# Patient Record
Sex: Female | Born: 1985 | Race: White | Hispanic: No | Marital: Married | Smoking: Never smoker
Health system: Southern US, Community
[De-identification: ages and names within clinical notes are randomized; demographics above are authoritative.]

## PROBLEM LIST (undated history)

## (undated) DIAGNOSIS — C50919 Malignant neoplasm of unspecified site of unspecified female breast: Secondary | ICD-10-CM

## (undated) DIAGNOSIS — F410 Panic disorder [episodic paroxysmal anxiety] without agoraphobia: Secondary | ICD-10-CM

## (undated) DIAGNOSIS — F419 Anxiety disorder, unspecified: Secondary | ICD-10-CM

## (undated) HISTORY — DX: Malignant neoplasm of unspecified site of unspecified female breast: C50.919

---

## 2018-06-28 ENCOUNTER — Other Ambulatory Visit: Payer: Self-pay

## 2018-06-28 ENCOUNTER — Emergency Department: Payer: Self-pay

## 2018-06-28 ENCOUNTER — Emergency Department
Admission: EM | Admit: 2018-06-28 | Discharge: 2018-06-28 | Disposition: A | Discharge status: Home / Self Care | Payer: Self-pay | Attending: Emergency Medicine | Admitting: Emergency Medicine

## 2018-06-28 ENCOUNTER — Encounter: Payer: Self-pay | Admitting: Emergency Medicine

## 2018-06-28 DIAGNOSIS — R1031 Right lower quadrant pain: Secondary | ICD-10-CM | POA: Insufficient documentation

## 2018-06-28 LAB — COMPREHENSIVE METABOLIC PANEL
ALT: 17 U/L (ref 0–44)
AST: 14 U/L — ABNORMAL LOW (ref 15–41)
Albumin: 3.9 g/dL (ref 3.5–5.0)
Alkaline Phosphatase: 79 U/L (ref 38–126)
Anion gap: 10 (ref 5–15)
BUN: 15 mg/dL (ref 6–20)
CO2: 23 mmol/L (ref 22–32)
Calcium: 9.1 mg/dL (ref 8.9–10.3)
Chloride: 107 mmol/L (ref 98–111)
Creatinine, Ser: 0.76 mg/dL (ref 0.44–1.00)
GFR calc Af Amer: >60 mL/min (ref >60)
GFR calc non Af Amer: >60 mL/min (ref >60)
Glucose, Bld: 103 mg/dL — ABNORMAL HIGH (ref 70–99)
Potassium: 4 mmol/L (ref 3.5–5.1)
Sodium: 140 mmol/L (ref 135–145)
Total Bilirubin: 0.8 mg/dL (ref 0.3–1.2)
Total Protein: 7.8 g/dL (ref 6.5–8.1)

## 2018-06-28 LAB — POCT PREGNANCY, URINE: Preg Test, Ur: NEGATIVE

## 2018-06-28 LAB — URINALYSIS, COMPLETE (UACMP) WITH MICROSCOPIC
Glucose, UA: NEGATIVE mg/dL
Hgb urine dipstick: NEGATIVE
Ketones, ur: NEGATIVE mg/dL
Nitrite: NEGATIVE
Protein, ur: 30 mg/dL — AB
Specific Gravity, Urine: 1.029 (ref 1.005–1.030)
pH: 5 (ref 5.0–8.0)

## 2018-06-28 LAB — CBC
HCT: 38.7 % (ref 36.0–46.0)
Hemoglobin: 11.8 g/dL — ABNORMAL LOW (ref 12.0–15.0)
MCH: 24.3 pg — ABNORMAL LOW (ref 26.0–34.0)
MCHC: 30.5 g/dL (ref 30.0–36.0)
MCV: 79.6 fL — ABNORMAL LOW (ref 80.0–100.0)
Platelets: 336 10*3/uL (ref 150–400)
RBC: 4.86 MIL/uL (ref 3.87–5.11)
RDW: 13.2 % (ref 11.5–15.5)
WBC: 7 10*3/uL (ref 4.0–10.5)
nRBC: 0 % (ref 0.0–0.2)

## 2018-06-28 LAB — LIPASE, BLOOD: Lipase: 25 U/L (ref 11–51)

## 2018-06-28 MED ORDER — MORPHINE SULFATE (PF) 4 MG/ML IV SOLN
4.0000 mg | Freq: Once | INTRAVENOUS | Status: AC
  Administered 2018-06-28: 4 mg via INTRAVENOUS
  Filled 2018-06-28: qty 1

## 2018-06-28 MED ORDER — IOHEXOL 300 MG/ML  SOLN
125.0000 mL | Freq: Once | INTRAMUSCULAR | Status: AC | PRN
  Administered 2018-06-28: 10:00:00 125 mL via INTRAVENOUS

## 2018-06-28 MED ORDER — IOHEXOL 240 MG/ML SOLN
50.0000 mL | Freq: Once | INTRAMUSCULAR | Status: AC | PRN
  Administered 2018-06-28: 09:00:00 50 mL via ORAL

## 2018-06-28 MED ORDER — ONDANSETRON 4 MG PO TBDP: 4.0000 mg | ORAL_TABLET | Freq: Three times a day (TID) | ORAL | 0 refills | Status: DC | PRN

## 2018-06-28 MED ORDER — SODIUM CHLORIDE 0.9 % IV BOLUS
1000.0000 mL | Freq: Once | INTRAVENOUS | Status: AC
  Administered 2018-06-28: 09:00:00 1000 mL via INTRAVENOUS

## 2018-06-28 MED ORDER — ONDANSETRON HCL 4 MG/2ML IJ SOLN
4.0000 mg | Freq: Once | INTRAMUSCULAR | Status: AC
  Administered 2018-06-28: 09:00:00 4 mg via INTRAVENOUS
  Filled 2018-06-28: qty 2

## 2018-06-28 NOTE — ED Triage Notes (Signed)
Pt presents to ED via POV with c/o RLQ abdominal pain, pt states pain has been intermittent for "years". Pt states sudden reoccurrence this morning. Pt also c/o N/V, states woke up this morning and "vomited blood". Pt A&O x4, obviously uncomfortable upon arrival to ED.

## 2018-06-28 NOTE — ED Notes (Signed)
Pt reports intermittent abdominal pain "for years", reports today pain returned. Pt states "I've had multiple ultrasounds and cat scans for the pain, but they say it might just be a cyst". Pt also reports that her brother and boyfriend both just had their appendix removed.

## 2018-06-28 NOTE — ED Provider Notes (Addendum)
[facility] Emergency Department Provider Note  Time seen: 8:29 AM  I have reviewed the triage vital signs and the nursing notes.   HISTORY  Chief Complaint Abdominal Pain and Hematemesis   HPI Sonya Fisher is a 33 y.o. female with no significant past medical history presents to the emergency department for right lower quadrant abdominal pain.  According to the patient for the past several months she has been experiencing back pain, is currently taking tramadol for her back pain.  States this morning she was nauseated and had several episodes of vomiting, the last episode of vomiting had some blood streaking in it.  Patient states she then developed moderate to severe right lower quadrant abdominal pain so she came to the emergency department for evaluation.  Patient continues to state severe sharp right lower quadrant abdominal pain.  Denies any vaginal bleeding, does state mild dysuria this morning.  Denies any hematuria.  Patient states she has been experiencing similar right lower quadrant abdominal pain intermittently for years but is never found a cause for the pain per patient.  Denies any fever cough or shortness of breath.   History reviewed. No pertinent past medical history.  There are no active problems to display for this patient.   History reviewed. No pertinent surgical history.  Prior to Admission medications   Not on File    Allergies  Allergen Reactions  . Amoxicillin   . Augmentin [Amoxicillin-Pot Clavulanate]   . Keflex [Cephalexin]   . Penicillins   . Sulfa Antibiotics     History reviewed. No pertinent family history.  Social History Social History   Tobacco Use  . Smoking status: Never Smoker  . Smokeless tobacco: Never Used  Substance Use Topics  . Alcohol use: Yes    Comment: Occ  . Drug use: Never    Review of Systems Constitutional: Negative for fever. Cardiovascular: Negative for chest pain. Respiratory: Negative  for shortness of breath. Gastrointestinal: Positive for right lower quadrant abdominal pain, nausea vomiting.  Negative for diarrhea. Genitourinary: Mild dysuria.  Negative for hematuria. Musculoskeletal: Negative for musculoskeletal complaints Skin: Negative for skin complaints  Neurological: Negative for headache All other ROS negative  ____________________________________________   PHYSICAL EXAM:  VITAL SIGNS: ED Triage Vitals  Enc Vitals Group     BP 06/28/18 0818 (!) 92/59     Pulse Rate 06/28/18 0818 88     Resp 06/28/18 0818 19     Temp 06/28/18 0818 98.4 F (36.9 C)     Temp Source 06/28/18 0818 Oral     SpO2 06/28/18 0818 96 %     Weight 06/28/18 0818 (!) 332 lb (150.6 kg)     Height 06/28/18 0818 5\' 9"  (1.753 m)     Head Circumference --      Peak Flow --      Pain Score 06/28/18 0814 9     Pain Loc --      Pain Edu? --      Excl. in [place]? --    Constitutional: Alert and oriented.  Mild distress holding her right lower quadrant. Eyes: Normal exam ENT      Head: Normocephalic and atraumatic.      Mouth/Throat: Mucous membranes are moist. Cardiovascular: Normal rate, regular rhythm. Respiratory: Normal respiratory effort without tachypnea nor retractions. Breath sounds are clear Gastrointestinal: Soft, moderate right lower quadrant tenderness to palpation.  No rebound guarding or distention. Musculoskeletal: Nontender with normal range of motion in all extremities.  Neurologic:  Normal speech and language. No gross focal neurologic deficits  Skin:  Skin is warm, dry and intact.  Psychiatric: Mood and affect are normal.     RADIOLOGY  IMPRESSION:  1. No demonstrable bowel obstruction. No abscess in the abdomen or  pelvis. No periappendiceal region inflammatory change.   2. Subcentimeter right sided lymph nodes. In the appropriate  clinical setting, this finding could be indicative of a degree of  mesenteric adenitis. There is no adenopathy by size criteria  in the  abdomen or pelvis.   3. 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 It may be prudent to  consider follow-up CT of the adrenals in 1 year to confirm  stability.   4. No evident renal or ureteral calculus. No hydronephrosis. Urinary  bladder wall thickness is within normal limits.   ____________________________________________   INITIAL IMPRESSION / ASSESSMENT AND PLAN / ED COURSE  Pertinent labs & imaging results that were available during my care of the patient were reviewed by me and considered in my medical decision making (see chart for details).   Patient presents to the emergency department for right lower quadrant abdominal pain.  Patient has moderate tenderness to palpation of the right lower quadrant.  Differential at this time would include appendicitis, ureterolithiasis, pyelonephritis or urinary tract infection, ovarian cyst, hemorrhagic cyst, chronic pain.  We will check labs, dose pain and nausea medication.  Given the reported severity of the pain we will proceed with CT imaging to rule out appendicitis or other intra-abdominal pathology.  Patient agreeable to plan of care.  Patient CT is largely negative for acute abnormality besides a possibility of mesenteric adenitis.  We will discharge the patient home with nausea medication, patient has Ultram she is currently taking at home.  I discussed CT findings including 1 cm left adrenal gland cyst and the need for repeat imaging in 1 year through her PCP.  Patient understands and is agreeable.  [name] was evaluated in Emergency Department on 06/28/2018 for the symptoms described in the history of present illness. She was evaluated in the context of the global COVID-19 pandemic, which necessitated consideration that the patient might be at risk for infection with the SARS-CoV-2 virus that causes COVID-19. Institutional protocols and algorithms that pertain to the evaluation of patients at risk for COVID-19 are in a state  of rapid change based on information released by regulatory bodies including the CDC and federal and state organizations. These policies and algorithms were followed during the patient's care in the ED.  ____________________________________________   FINAL CLINICAL IMPRESSION(S) / ED DIAGNOSES  Right lower quadrant abdominal pain Nausea vomiting   Harvest Dark, MD 06/28/18 1005    Harvest Dark, MD 06/28/18 1009

## 2018-07-02 ENCOUNTER — Encounter: Payer: Self-pay | Admitting: Emergency Medicine

## 2018-07-02 ENCOUNTER — Emergency Department: Payer: Self-pay

## 2018-07-02 ENCOUNTER — Emergency Department
Admission: EM | Admit: 2018-07-02 | Discharge: 2018-07-02 | Disposition: A | Discharge status: Home / Self Care | Payer: Self-pay | Attending: Emergency Medicine | Admitting: Emergency Medicine

## 2018-07-02 ENCOUNTER — Other Ambulatory Visit: Payer: Self-pay

## 2018-07-02 DIAGNOSIS — R079 Chest pain, unspecified: Secondary | ICD-10-CM | POA: Insufficient documentation

## 2018-07-02 DIAGNOSIS — R059 Cough, unspecified: Secondary | ICD-10-CM

## 2018-07-02 DIAGNOSIS — R05 Cough: Secondary | ICD-10-CM | POA: Insufficient documentation

## 2018-07-02 HISTORY — DX: Panic disorder (episodic paroxysmal anxiety): F41.0

## 2018-07-02 HISTORY — DX: Anxiety disorder, unspecified: F41.9

## 2018-07-02 LAB — BASIC METABOLIC PANEL
Anion gap: 8 (ref 5–15)
BUN: 15 mg/dL (ref 6–20)
CO2: 24 mmol/L (ref 22–32)
Calcium: 8.9 mg/dL (ref 8.9–10.3)
Chloride: 108 mmol/L (ref 98–111)
Creatinine, Ser: 0.83 mg/dL (ref 0.44–1.00)
GFR calc Af Amer: >60 mL/min (ref >60)
GFR calc non Af Amer: >60 mL/min (ref >60)
Glucose, Bld: 122 mg/dL — ABNORMAL HIGH (ref 70–99)
Potassium: 3.8 mmol/L (ref 3.5–5.1)
Sodium: 140 mmol/L (ref 135–145)

## 2018-07-02 LAB — CBC
HCT: 38.3 % (ref 36.0–46.0)
Hemoglobin: 11.6 g/dL — ABNORMAL LOW (ref 12.0–15.0)
MCH: 24.6 pg — ABNORMAL LOW (ref 26.0–34.0)
MCHC: 30.3 g/dL (ref 30.0–36.0)
MCV: 81.3 fL (ref 80.0–100.0)
Platelets: 330 10*3/uL (ref 150–400)
RBC: 4.71 MIL/uL (ref 3.87–5.11)
RDW: 13.4 % (ref 11.5–15.5)
WBC: 8.1 10*3/uL (ref 4.0–10.5)
nRBC: 0 % (ref 0.0–0.2)

## 2018-07-02 LAB — TROPONIN I (HIGH SENSITIVITY): Troponin I (High Sensitivity): <2 ng/L (ref <18)

## 2018-07-02 MED ORDER — ALBUTEROL SULFATE HFA 108 (90 BASE) MCG/ACT IN AERS
2.0000 | INHALATION_SPRAY | RESPIRATORY_TRACT | Status: DC | PRN
  Administered 2018-07-02: 10:00:00 2 via RESPIRATORY_TRACT
  Filled 2018-07-02: qty 6.7

## 2018-07-02 NOTE — ED Notes (Signed)
Patient states that her pain has increased. Vital signs remain stable. Patient given update on wait time.

## 2018-07-02 NOTE — Discharge Instructions (Addendum)
Please seek medical attention for any high fevers, chest pain, shortness of breath, change in behavior, persistent vomiting, bloody stool or any other new or concerning symptoms.  

## 2018-07-02 NOTE — ED Triage Notes (Signed)
Patient with complaint of central chest pain, shortness of breath and dizziness that started about 20:00 last night. Patient states that she also has a rash to her lower abdomen that started yesterday afternoon.

## 2018-07-02 NOTE — ED Provider Notes (Signed)
[facility] Emergency Department Provider Note  ____________________________________________   I have reviewed the triage vital signs and the nursing notes.   HISTORY  Chief Complaint Chest Pain   History limited by: Not Limited   HPI Sonya Fisher is a 33 y.o. female who presents to the emergency department today because of concern for chest pain that started roughly 12 hours ago. The patient describes it as a tight feeling across her chest, with the worst tightness in the center of her chest. She says she has had some cough with this before. Denies similar symptoms in the past. She denies any unusual activity. Denies any fevers.    Records reviewed. Per medical record review patient has a history of anxiety. Visit to OSH ED last year for chest pain.   Past Medical History:  Diagnosis Date  . Anxiety   . Panic attacks     There are no active problems to display for this patient.   History reviewed. No pertinent surgical history.  Prior to Admission medications   Medication Sig Start Date End Date Taking? Authorizing Provider  ondansetron (ZOFRAN ODT) 4 MG disintegrating tablet Take 1 tablet (4 mg total) by mouth every 8 (eight) hours as needed for nausea or vomiting. 06/28/18   Harvest Dark, MD    Allergies Amoxicillin, Augmentin [amoxicillin-pot clavulanate], Keflex [cephalexin], Penicillins, and Sulfa antibiotics  No family history on file.  Social History Social History   Tobacco Use  . Smoking status: Never Smoker  . Smokeless tobacco: Never Used  Substance Use Topics  . Alcohol use: Not Currently  . Drug use: Never    Review of Systems Constitutional: No fever/chills Eyes: No visual changes. ENT: No sore throat. Cardiovascular: Positive for chest pain.  Respiratory: Positive for shortness of breath and cough. Gastrointestinal: No abdominal pain.  No nausea, no vomiting.  No diarrhea.   Genitourinary: Negative for  dysuria. Musculoskeletal: Negative for back pain. Skin: Positive for hives. Neurological: Negative for headaches, focal weakness or numbness.  ____________________________________________   PHYSICAL EXAM:  VITAL SIGNS: ED Triage Vitals  Enc Vitals Group     BP 07/02/18 0321 121/71     Pulse Rate 07/02/18 0320 68     Resp 07/02/18 0320 18     Temp 07/02/18 0320 98.7 F (37.1 C)     Temp Source 07/02/18 0320 Oral     SpO2 07/02/18 0320 97 %     Weight 07/02/18 0320 (!) 330 lb 11 oz (150 kg)     Height 07/02/18 0320 5\' 9"  (1.753 m)     Head Circumference --      Peak Flow --      Pain Score 07/02/18 0320 8   Constitutional: Alert and oriented.  Eyes: Conjunctivae are normal.  ENT      Head: Normocephalic and atraumatic.      Nose: No congestion/rhinnorhea.      Mouth/Throat: Mucous membranes are moist.      Neck: No stridor. Hematological/Lymphatic/Immunilogical: No cervical lymphadenopathy. Cardiovascular: Normal rate, regular rhythm.  No murmurs, rubs, or gallops.  Respiratory: Normal respiratory effort without tachypnea nor retractions. Breath sounds are clear and equal bilaterally. No wheezes/rales/rhonchi. Gastrointestinal: Soft and non tender. No rebound. No guarding.  Genitourinary: Deferred Musculoskeletal: Normal range of motion in all extremities. No lower extremity edema. Neurologic:  Normal speech and language. No gross focal neurologic deficits are appreciated.  Skin:  Skin is warm, dry and intact. No rash noted. Psychiatric: Mood and affect are  normal. Speech and behavior are normal. Patient exhibits appropriate insight and judgment.  ____________________________________________    LABS (pertinent positives/negatives)  Trop high sensitivity <2 CBC wbc 8.1, hgb 11.6, plt 330 BMP wnl except glu 122  ____________________________________________   EKG  I, Nance Pear, attending physician, personally viewed and interpreted this EKG  EKG Time:  0328 Rate: 66 Rhythm: normal sinus rhythm Axis: normal Intervals: qtc 440 QRS: narrow ST changes: no st elevation Impression: normal ekg  ____________________________________________    RADIOLOGY  CXR No acute abnormality  ____________________________________________   PROCEDURES  Procedures  ____________________________________________   INITIAL IMPRESSION / ASSESSMENT AND PLAN / ED COURSE  Pertinent labs & imaging results that were available during my care of the patient were reviewed by me and considered in my medical decision making (see chart for details).   Patient presented to the emergency department today because of concern for chest tightness, cough and some shortness of breath. CXR without pna or ptx. Troponin negative. EKG without concerning arrhythmia. She did feel some improvement after albuterol inhaler. Will plan on discharging with PCP follow up. Discussed findings and plan with patient.  ____________________________________________   FINAL CLINICAL IMPRESSION(S) / ED DIAGNOSES  Final diagnoses:  Nonspecific chest pain  Cough     Note: This dictation was prepared with Dragon dictation. Any transcriptional errors that result from this process are unintentional     Nance Pear, MD 07/02/18 [phone]

## 2018-07-13 ENCOUNTER — Emergency Department
Admission: EM | Admit: 2018-07-13 | Discharge: 2018-07-13 | Disposition: A | Discharge status: Home / Self Care | Payer: Self-pay | Attending: Emergency Medicine | Admitting: Emergency Medicine

## 2018-07-13 ENCOUNTER — Emergency Department: Payer: Self-pay

## 2018-07-13 DIAGNOSIS — R1031 Right lower quadrant pain: Secondary | ICD-10-CM | POA: Insufficient documentation

## 2018-07-13 LAB — COMPREHENSIVE METABOLIC PANEL
ALT: 16 U/L (ref 0–44)
AST: 16 U/L (ref 15–41)
Albumin: 4 g/dL (ref 3.5–5.0)
Alkaline Phosphatase: 74 U/L (ref 38–126)
Anion gap: 7 (ref 5–15)
BUN: 13 mg/dL (ref 6–20)
CO2: 26 mmol/L (ref 22–32)
Calcium: 9.2 mg/dL (ref 8.9–10.3)
Chloride: 106 mmol/L (ref 98–111)
Creatinine, Ser: 0.79 mg/dL (ref 0.44–1.00)
GFR calc Af Amer: >60 mL/min (ref >60)
GFR calc non Af Amer: >60 mL/min (ref >60)
Glucose, Bld: 106 mg/dL — ABNORMAL HIGH (ref 70–99)
Potassium: 4.1 mmol/L (ref 3.5–5.1)
Sodium: 139 mmol/L (ref 135–145)
Total Bilirubin: 0.3 mg/dL (ref 0.3–1.2)
Total Protein: 7.3 g/dL (ref 6.5–8.1)

## 2018-07-13 LAB — URINALYSIS, COMPLETE (UACMP) WITH MICROSCOPIC
Bacteria, UA: NONE SEEN
Bilirubin Urine: NEGATIVE
Glucose, UA: NEGATIVE mg/dL
Hgb urine dipstick: NEGATIVE
Ketones, ur: NEGATIVE mg/dL
Nitrite: NEGATIVE
Protein, ur: NEGATIVE mg/dL
Specific Gravity, Urine: 1.03 (ref 1.005–1.030)
pH: 5 (ref 5.0–8.0)

## 2018-07-13 LAB — CBC
HCT: 36.3 % (ref 36.0–46.0)
Hemoglobin: 11 g/dL — ABNORMAL LOW (ref 12.0–15.0)
MCH: 24.2 pg — ABNORMAL LOW (ref 26.0–34.0)
MCHC: 30.3 g/dL (ref 30.0–36.0)
MCV: 79.8 fL — ABNORMAL LOW (ref 80.0–100.0)
Platelets: 380 10*3/uL (ref 150–400)
RBC: 4.55 MIL/uL (ref 3.87–5.11)
RDW: 13.5 % (ref 11.5–15.5)
WBC: 8.2 10*3/uL (ref 4.0–10.5)
nRBC: 0 % (ref 0.0–0.2)

## 2018-07-13 LAB — POCT PREGNANCY, URINE: Preg Test, Ur: NEGATIVE

## 2018-07-13 LAB — LIPASE, BLOOD: Lipase: 31 U/L (ref 11–51)

## 2018-07-13 MED ORDER — ONDANSETRON HCL 4 MG/2ML IJ SOLN
INTRAMUSCULAR | Status: AC
  Administered 2018-07-13: 4 mg
  Filled 2018-07-13: qty 2

## 2018-07-13 MED ORDER — DICYCLOMINE HCL 20 MG PO TABS: 20.0000 mg | ORAL_TABLET | Freq: Three times a day (TID) | ORAL | 0 refills | Status: DC | PRN

## 2018-07-13 MED ORDER — MORPHINE SULFATE (PF) 2 MG/ML IV SOLN
INTRAVENOUS | Status: AC
  Administered 2018-07-13: 2 mg via INTRAVENOUS
  Filled 2018-07-13: qty 1

## 2018-07-13 MED ORDER — IOPAMIDOL (ISOVUE-370) INJECTION 76%
100.0000 mL | Freq: Once | INTRAVENOUS | Status: AC | PRN
  Administered 2018-07-13: 100 mL via INTRAVENOUS

## 2018-07-13 NOTE — ED Provider Notes (Signed)
[facility] Emergency Department Provider Note   First MD Initiated Contact with Patient 07/13/18 0222     (approximate)  I have reviewed the triage vital signs and the nursing notes.   HISTORY  Chief Complaint Abdominal Pain   HPI Sonya Fisher is a 33 y.o. female with below list of previous medical conditions presents to the emergency department secondary to right lower quadrant abdominal pain which patient states is currently 10 out of 10.  Patient denies any nausea vomiting diarrhea constipation.  Patient denies any fever.  Patient states that she has had similar episodes of pain in the same area over the past 3 years however no clear etiology identified.  Patient states that she has never seen gastroenterology in regards to the pain.        Past Medical History:  Diagnosis Date   Anxiety    Panic attacks     There are no active problems to display for this patient.   History reviewed. No pertinent surgical history.  Prior to Admission medications   Medication Sig Start Date End Date Taking? Authorizing Provider  ondansetron (ZOFRAN ODT) 4 MG disintegrating tablet Take 1 tablet (4 mg total) by mouth every 8 (eight) hours as needed for nausea or vomiting. 06/28/18   Harvest Dark, MD    Allergies Amoxicillin, Augmentin [amoxicillin-pot clavulanate], Keflex [cephalexin], Penicillins, Sulfa antibiotics, and Transact [sulfur]  No family history on file.  Social History Social History   Tobacco Use   Smoking status: Never Smoker   Smokeless tobacco: Never Used  Substance Use Topics   Alcohol use: Not Currently   Drug use: Never    Review of Systems Constitutional: No fever/chills Eyes: No visual changes. ENT: No sore throat. Cardiovascular: Denies chest pain. Respiratory: Denies shortness of breath. Gastrointestinal: Positive for abdominal pain.  No nausea, no vomiting.  No diarrhea.  No constipation. Genitourinary: Negative  for dysuria. Musculoskeletal: Negative for neck pain.  Negative for back pain. Integumentary: Negative for rash. Neurological: Negative for headaches, focal weakness or numbness.   ____________________________________________   PHYSICAL EXAM:  VITAL SIGNS: ED Triage Vitals  Enc Vitals Group     BP 07/13/18 0043 (!) 141/59     Pulse Rate 07/13/18 0043 82     Resp 07/13/18 0043 18     Temp 07/13/18 0043 98 F (36.7 C)     Temp Source 07/13/18 0043 Oral     SpO2 07/13/18 0043 99 %     Weight 07/13/18 0044 (!) 150.6 kg (332 lb)     Height 07/13/18 0044 1.753 m (5\' 9" )     Head Circumference --      Peak Flow --      Pain Score 07/13/18 0235 10     Pain Loc --      Pain Edu? --      Excl. in [place]? --     Constitutional: Alert and oriented. Well appearing and in no acute distress. Eyes: Conjunctivae are normal.  Mouth/Throat: Mucous membranes are moist.  Oropharynx non-erythematous. Neck: No stridor.  Cardiovascular: Normal rate, regular rhythm. Good peripheral circulation. Grossly normal heart sounds. Respiratory: Normal respiratory effort.  No retractions. No audible wheezing. Gastrointestinal: Right lower quadrant tenderness to palpation.  No distention.  Musculoskeletal: No lower extremity tenderness nor edema. No gross deformities of extremities. Neurologic:  Normal speech and language. No gross focal neurologic deficits are appreciated.  Skin:  Skin is warm, dry and intact. No rash noted. Psychiatric: Mood  and affect are normal. Speech and behavior are normal.  ____________________________________________   LABS (all labs ordered are listed, but only abnormal results are displayed)  Labs Reviewed  COMPREHENSIVE METABOLIC PANEL - Abnormal; Notable for the following components:      Result Value   Glucose, Bld 106 (*)    All other components within normal limits  CBC - Abnormal; Notable for the following components:   Hemoglobin 11.0 (*)    MCV 79.8 (*)    MCH 24.2  (*)    All other components within normal limits  URINALYSIS, COMPLETE (UACMP) WITH MICROSCOPIC - Abnormal; Notable for the following components:   Color, Urine YELLOW (*)    APPearance CLOUDY (*)    Leukocytes,Ua TRACE (*)    All other components within normal limits  LIPASE, BLOOD  POC URINE PREG, ED  POCT PREGNANCY, URINE   __________  RADIOLOGY I, Carrizo Hill N Macrae Wiegman, personally viewed and evaluated these images (plain radiographs) as part of my medical decision making, as well as reviewing the written report by the radiologist.  ED MD interpretation: No acute abnormality or explanation for the patient's abdominal pain noted on CT abdomen pelvis per radiologist.  Official radiology report(s): Ct Abdomen Pelvis W Contrast  Result Date: 07/13/2018 CLINICAL DATA:  Right lower quadrant abdominal pain. EXAM: CT ABDOMEN AND PELVIS WITH CONTRAST TECHNIQUE: Multidetector CT imaging of the abdomen and pelvis was performed using the standard protocol following bolus administration of intravenous contrast. CONTRAST:  128mL ISOVUE-370 IOPAMIDOL (ISOVUE-370) INJECTION 76% COMPARISON:  CT 2 weeks ago, 06/28/2018 FINDINGS: Lower chest: Minor hypoventilatory change. No consolidation or pleural fluid. Hepatobiliary: No focal liver abnormality is seen. No gallstones, gallbladder wall thickening, or biliary dilatation. Pancreas: No ductal dilatation or inflammation. Spleen: Normal in size without focal abnormality. Adrenals/Urinary Tract: 1 cm left adrenal nodule, unchanged from recent exam. Normal right adrenal gland. No hydronephrosis or perinephric edema. Homogeneous renal enhancement. Urinary bladder is minimally distended without wall thickening. Stomach/Bowel: Lack of enteric contrast limits detailed bowel assessment. Stomach physiologically distended. No bowel wall thickening, inflammatory change, or obstruction. Normal appendix, image 49 series 5. Small to moderate colonic stool burden without colonic  wall thickening or inflammation. Vascular/Lymphatic: Normal caliber abdominal aorta. Multiple small retroperitoneal and central mesenteric nodes, not enlarged by size criteria. Portal vein is patent. Reproductive: Uterus and bilateral adnexa are unremarkable. Other: No free air, free fluid, or intra-abdominal fluid collection. Musculoskeletal: There are no acute or suspicious osseous abnormalities. IMPRESSION: 1. No acute abnormality or explanation for abdominal pain. Normal appendix. 2. Stable 1 cm left adrenal nodule, likely a benign adenoma. Electronically Signed   By: Keith Rake M.D.   On: 07/13/2018 03:39      Procedures   ____________________________________________   INITIAL IMPRESSION / MDM / [place] / ED COURSE  As part of my medical decision making, I reviewed the following data within the electronic MEDICAL RECORD NUMBER  33 year old female presented with above-stated history and physical exam secondary to right lower quadrant abdominal pain.  Considered possibly of appendicitis ovarian cysts ureterolithiasis colitis and inflammatory bowel disease given chronicity of the patient's symptoms CT scan revealed no acute abnormality or explanation for the patient's pain.  Patient will be referred to gastroenterology Dr. Allen Norris for further outpatient evaluation.  ____________________________________________  FINAL CLINICAL IMPRESSION(S) / ED DIAGNOSES  Final diagnoses:  Right lower quadrant abdominal pain     MEDICATIONS GIVEN DURING THIS VISIT:  Medications  ondansetron (ZOFRAN) 4 MG/2ML injection (4 mg  Given 07/13/18 0230)  morphine 2 MG/ML injection (2 mg Intravenous Given 07/13/18 0230)  iopamidol (ISOVUE-370) 76 % injection 100 mL (100 mLs Intravenous Contrast Given 07/13/18 0310)     ED Discharge Orders    None      *Please note:  Sonya Fisher was evaluated in Emergency Department on 07/13/2018 for the symptoms described in the history of present illness. She  was evaluated in the context of the global COVID-19 pandemic, which necessitated consideration that the patient might be at risk for infection with the SARS-CoV-2 virus that causes COVID-19. Institutional protocols and algorithms that pertain to the evaluation of patients at risk for COVID-19 are in a state of rapid change based on information released by regulatory bodies including the CDC and federal and state organizations. These policies and algorithms were followed during the patient's care in the ED.  Some ED evaluations and interventions may be delayed as a result of limited staffing during the pandemic.*  Note:  This document was prepared using Dragon voice recognition software and may include unintentional dictation errors.   Gregor Hams, MD 07/13/18 [phone]

## 2018-07-13 NOTE — ED Triage Notes (Signed)
Patient c/o lower right quadrant abdominal pain and diarrhea. Patient denies urinary changes.

## 2018-08-14 ENCOUNTER — Other Ambulatory Visit: Payer: Self-pay

## 2018-08-15 ENCOUNTER — Ambulatory Visit (INDEPENDENT_AMBULATORY_CARE_PROVIDER_SITE_OTHER): Payer: Self-pay | Admitting: Gastroenterology

## 2018-08-15 ENCOUNTER — Other Ambulatory Visit: Payer: Self-pay

## 2018-08-15 ENCOUNTER — Encounter: Payer: Self-pay | Admitting: Gastroenterology

## 2018-08-15 VITALS — BP 122/73 | HR 77 | Temp 98.8°F | Ht 69.0 in | Wt 339.6 lb

## 2018-08-15 DIAGNOSIS — K582 Mixed irritable bowel syndrome: Secondary | ICD-10-CM

## 2018-08-15 MED ORDER — DICYCLOMINE HCL 20 MG PO TABS: 20.0000 mg | ORAL_TABLET | Freq: Three times a day (TID) | ORAL | 3 refills | Status: DC

## 2018-08-15 NOTE — Progress Notes (Signed)
Gastroenterology Consultation  Referring Provider:     Dr. Archie Balboa Primary Care Physician:  Patient, No Pcp Per Primary Gastroenterologist:  Dr. Allen Norris     Reason for Consultation:     Abdominal pain with alternating diarrhea constipation        HPI:   Sonya Fisher is a 33 y.o. y/o female referred for consultation & management of abdominal pain with alternating diarrhea and constipation by Dr. Patient, No Pcp Per.  This patient comes in today after being in the ER multiple times for abdominal pain.  The patient has had imaging of her abdomen without any cause for her GI symptoms.  The patient states that she usually has diarrhea in the morning and then again in the evening with anywhere from 1-2 episodes of diarrhea a day.  She states that this then will alternate with constipation which she states is occurring right now.  The patient states that she was told in the ER that she may have Crohn's disease despite not having any bloody stools unexplained weight loss or extraintestinal manifestations of Crohn's disease.  She also denies any family history of inflammatory bowel disease.  The patient is obese and states that she has recently gained weight and not lost weight.  The diarrhea when it occurs does not wake her up from a sound sleep.  She also reports that she feels better after she moves her bowels.  Past Medical History:  Diagnosis Date  . Anxiety   . Panic attacks     History reviewed. No pertinent surgical history.  Prior to Admission medications   Medication Sig Start Date End Date Taking? Authorizing Provider  dicyclomine (BENTYL) 20 MG tablet Take 1 tablet (20 mg total) by mouth 3 (three) times daily as needed for spasms. 07/13/18 07/13/19 Yes Gregor Hams, MD  escitalopram (LEXAPRO) 20 MG tablet Take 20 mg by mouth daily. 07/10/18  Yes [provider]  naproxen (NAPROSYN) 500 MG tablet Take by mouth.   Yes [provider]  omeprazole (PRILOSEC) 20 MG capsule  Take 20 mg by mouth daily. 06/01/18  Yes [provider]  ondansetron (ZOFRAN ODT) 4 MG disintegrating tablet Take 1 tablet (4 mg total) by mouth every 8 (eight) hours as needed for nausea or vomiting. 06/28/18  Yes Harvest Dark, MD  tiZANidine (ZANAFLEX) 4 MG tablet TAKE 1 TABLET BY MOUTH THREE TIMES DAILY AS NEEDED FOR MUSCLE SPASM UP TO FOR 30 DOSES 06/20/18  Yes [provider]  traMADol (ULTRAM) 50 MG tablet TAKE 1 TABLET BY MOUTH EVERY 8 HOURS AS NEEDED FOR UP TO 5 DAYS 06/20/18  Yes [provider]    History reviewed. No pertinent family history.   Social History   Tobacco Use  . Smoking status: Never Smoker  . Smokeless tobacco: Never Used  Substance Use Topics  . Alcohol use: Not Currently  . Drug use: Never    Allergies as of 08/15/2018 - Review Complete 08/15/2018  Allergen Reaction Noted  . Amoxicillin  06/28/2018  . Augmentin [amoxicillin-pot clavulanate]  06/28/2018  . Keflex [cephalexin]  06/28/2018  . Penicillins  06/28/2018  . Sulfa antibiotics  06/28/2018  . Transact [sulfur]  07/13/2018    Review of Systems:    All systems reviewed and negative except where noted in HPI.   Physical Exam:  BP 122/73   Pulse 77   Temp 98.8 F (37.1 C) (Oral)   Ht 5\' 9"  (1.753 m)   Wt (!) 339 lb  9.6 oz (154 kg)   BMI 50.15 kg/m  No LMP recorded. General:   Alert,  Well-developed, well-nourished, pleasant and cooperative in NAD Head:  Normocephalic and atraumatic. Eyes:  Sclera clear, no icterus.   Conjunctiva pink. Ears:  Normal auditory acuity. Nose:  No deformity, discharge, or lesions. Mouth:  No deformity or lesions,oropharynx pink & moist. Neck:  Supple; no masses or thyromegaly. Lungs:  Respirations even and unlabored.  Clear throughout to auscultation.   No wheezes, crackles, or rhonchi. No acute distress. Heart:  Regular rate and rhythm; no murmurs, clicks, rubs, or gallops. Abdomen:  Normal bowel sounds.  No bruits.  Soft,  non-tender and non-distended without masses, hepatosplenomegaly or hernias noted.  No guarding or rebound tenderness.  Negative Carnett sign.   Rectal:  Deferred.  Msk:  Symmetrical without gross deformities.  Good, equal movement & strength bilaterally. Pulses:  Normal pulses noted. Extremities:  No clubbing or edema.  No cyanosis. Neurologic:  Alert and oriented x3;  grossly normal neurologically. Skin:  Intact without significant lesions or rashes.  No jaundice. Lymph Nodes:  No significant cervical adenopathy. Psych:  Alert and cooperative. Normal mood and affect.  Imaging Studies: No results found.  Assessment and Plan:   Sonya Fisher is a 33 y.o. y/o female who comes in today with alternating diarrhea constipation with abdominal bloating and abdominal pain.  The patient does not have any worry symptoms such as rectal bleeding, family history of colon cancer or colon polyps or unexplained weight loss.  Her symptoms are most consistent with irritable bowel syndrome and she has done better with the short course of dicyclomine she was given at the ER.  The patient was told to increase fiber in her diet and has been given samples of Metamucil.  The patient has been told that Citrucel may be less gas-forming and she may do better with that.  She has been told that if her symptoms do not improve then she can contact our office for further recommendations.  The patient has been explained the plan and agrees with it.  Lucilla Lame, MD. Marval Regal    Note: This dictation was prepared with Dragon dictation along with smaller phrase technology. Any transcriptional errors that result from this process are unintentional.

## 2018-08-23 ENCOUNTER — Ambulatory Visit: Payer: Self-pay | Admitting: Gastroenterology

## 2018-12-01 ENCOUNTER — Other Ambulatory Visit: Payer: Self-pay

## 2018-12-01 ENCOUNTER — Encounter: Payer: Self-pay | Admitting: Emergency Medicine

## 2018-12-01 DIAGNOSIS — N751 Abscess of Bartholin's gland: Secondary | ICD-10-CM | POA: Insufficient documentation

## 2018-12-01 DIAGNOSIS — Z79899 Other long term (current) drug therapy: Secondary | ICD-10-CM | POA: Insufficient documentation

## 2018-12-01 NOTE — ED Triage Notes (Signed)
Pt arrived via POV with reports of vaginal pain due to abscess on vagina.  Pt states it has been there for several days, states its worse with walking.   Pt states she has noticed some small red spots on pad, but otherwise denies any discharge.  Pt states she has hx of the same kind of abscess in the past.

## 2018-12-01 NOTE — ED Notes (Signed)
FIRST NURSE NOTE:  Pt c/o boil to vaginal area.

## 2018-12-02 ENCOUNTER — Emergency Department
Admission: EM | Admit: 2018-12-02 | Discharge: 2018-12-02 | Disposition: A | Discharge status: Home / Self Care | Payer: Self-pay | Attending: Emergency Medicine | Admitting: Emergency Medicine

## 2018-12-02 DIAGNOSIS — N751 Abscess of Bartholin's gland: Secondary | ICD-10-CM

## 2018-12-02 MED ORDER — CLINDAMYCIN HCL 150 MG PO CAPS
300.0000 mg | ORAL_CAPSULE | Freq: Once | ORAL | Status: AC
  Administered 2018-12-02: 300 mg via ORAL
  Filled 2018-12-02: qty 2

## 2018-12-02 MED ORDER — CLINDAMYCIN HCL 300 MG PO CAPS: 300.0000 mg | ORAL_CAPSULE | Freq: Three times a day (TID) | ORAL | 0 refills | Status: AC

## 2018-12-02 MED ORDER — FLUCONAZOLE 50 MG PO TABS
150.0000 mg | ORAL_TABLET | Freq: Once | ORAL | Status: AC
  Administered 2018-12-02: 150 mg via ORAL
  Filled 2018-12-02: qty 1

## 2018-12-02 MED ORDER — OXYCODONE-ACETAMINOPHEN 5-325 MG PO TABS: 1.0000 | ORAL_TABLET | ORAL | 0 refills | Status: AC | PRN

## 2018-12-02 MED ORDER — OXYCODONE-ACETAMINOPHEN 5-325 MG PO TABS
1.0000 | ORAL_TABLET | Freq: Once | ORAL | Status: AC
  Administered 2018-12-02: 02:00:00 1 via ORAL
  Filled 2018-12-02: qty 1

## 2018-12-02 NOTE — ED Provider Notes (Signed)
[facility] Emergency Department Provider Note ______   First MD Initiated Contact with Patient 12/02/18 0131     (approximate)  I have reviewed the triage vital signs and the nursing notes.   HISTORY  Chief Complaint Vaginal Pain (Abscess on vagina per pt)   HPI Sonya Fisher is a 33 y.o. female with below list of previous medical conditions including previous Bartholin abscess presents to the emergency department secondary to 2-day history of left side vaginal discomfort with concern for possible recurrence of abscess patient says she has had at least 3 or 4 in the past "couple years".  Patient denies any fever.  Patient also states that she believes she may have a yeast infection for which she has been taking Azo and cranberry juice        Past Medical History:  Diagnosis Date  . Anxiety   . Panic attacks     There are no active problems to display for this patient.   History reviewed. No pertinent surgical history.  Prior to Admission medications   Medication Sig Start Date End Date Taking? Authorizing Provider  dicyclomine (BENTYL) 20 MG tablet Take 1 tablet (20 mg total) by mouth 3 (three) times daily before meals. 08/15/18   Lucilla Lame, MD  escitalopram (LEXAPRO) 20 MG tablet Take 20 mg by mouth daily. 07/10/18   [provider]  naproxen (NAPROSYN) 500 MG tablet Take by mouth.    [provider]  omeprazole (PRILOSEC) 20 MG capsule Take 20 mg by mouth daily. 06/01/18   [provider]  ondansetron (ZOFRAN ODT) 4 MG disintegrating tablet Take 1 tablet (4 mg total) by mouth every 8 (eight) hours as needed for nausea or vomiting. 06/28/18   Harvest Dark, MD  tiZANidine (ZANAFLEX) 4 MG tablet TAKE 1 TABLET BY MOUTH THREE TIMES DAILY AS NEEDED FOR MUSCLE SPASM UP TO FOR 30 DOSES 06/20/18   [provider]  traMADol (ULTRAM) 50 MG tablet TAKE 1 TABLET BY MOUTH EVERY 8 HOURS AS NEEDED FOR UP TO 5 DAYS 06/20/18    [provider]    Allergies Amoxicillin, Augmentin [amoxicillin-pot clavulanate], Keflex [cephalexin], Penicillins, Sulfa antibiotics, and Transact [sulfur]  History reviewed. No pertinent family history.  Social History Social History   Tobacco Use  . Smoking status: Never Smoker  . Smokeless tobacco: Never Used  Substance Use Topics  . Alcohol use: Not Currently  . Drug use: Never    Review of Systems Constitutional: No fever/chills Eyes: No visual changes. ENT: No sore throat. Cardiovascular: Denies chest pain. Respiratory: Denies shortness of breath. Gastrointestinal: No abdominal pain.  No nausea, no vomiting.  No diarrhea.  No constipation. Genitourinary: Negative for dysuria.  Positive for vaginal discomfort Musculoskeletal: Negative for neck pain.  Negative for back pain. Integumentary: Negative for rash. Neurological: Negative for headaches, focal weakness or numbness.  ____________________________________________   PHYSICAL EXAM:  VITAL SIGNS: ED Triage Vitals  Enc Vitals Group     BP 12/01/18 2254 (!) 150/76     Pulse Rate 12/01/18 2254 93     Resp 12/01/18 2254 20     Temp 12/01/18 2254 98.4 F (36.9 C)     Temp Source 12/01/18 2254 Oral     SpO2 12/01/18 2254 95 %     Weight 12/01/18 2252 99.8 kg (220 lb)     Height 12/01/18 2252 1.753 m ('5\' 9"'$ )     Head Circumference --      Peak Flow --  Pain Score 12/01/18 2252 9     Pain Loc --      Pain Edu? --      Excl. in [place]? --     Constitutional: Alert and oriented. Eyes: Conjunctivae are normal.  Mouth/Throat: Patient is wearing a mask. Neck: No stridor.  No meningeal signs.   Cardiovascular: Normal rate, regular rhythm. Good peripheral circulation. Grossly normal heart sounds. Respiratory: Normal respiratory effort.  No retractions. Gastrointestinal: Soft and nontender. No distention.  Genitourinary: Yeast noted on the visual exam of the vagina.  Tenderness to the left labia majora.   No palpable abscess at this time Musculoskeletal: No lower extremity tenderness nor edema. No gross deformities of extremities. Neurologic:  Normal speech and language. No gross focal neurologic deficits are appreciated.  Skin:  Skin is warm, dry and intact.   _________________________________________ Procedures   ____________________________________________   INITIAL IMPRESSION / MDM / ASSESSMENT AND PLAN / ED COURSE  As part of my medical decision making, I reviewed the following data within the electronic MEDICAL RECORD NUMBER   33 year old female presented with above-stated history and physical exam concern for possible Bartholin abscess.  Patient does have tenderness to the labia majora however no palpable abscess at this time.  Given criteria for I&D of greater than 3 cm not met incision and drainage was not performed.  Patient given clindamycin secondary to allergy profile.  Patient also given Percocet and Diflucan.  I advised the patient to follow-up with gynecology if symptoms were to worsen.  She is being referred to the [place].  ____________________________________________  FINAL CLINICAL IMPRESSION(S) / ED DIAGNOSES  Final diagnoses:  Bartholin's gland abscess     MEDICATIONS GIVEN DURING THIS VISIT:  Medications  fluconazole (DIFLUCAN) tablet 150 mg (has no administration in time range)  clindamycin (CLEOCIN) capsule 300 mg (has no administration in time range)  oxyCODONE-acetaminophen (PERCOCET/ROXICET) 5-325 MG per tablet 1 tablet (has no administration in time range)     ED Discharge Orders    None      *Please note:  Wm Fruchter was evaluated in Emergency Department on 12/02/2018 for the symptoms described in the history of present illness. She was evaluated in the context of the global COVID-19 pandemic, which necessitated consideration that the patient might be at risk for infection with the SARS-CoV-2 virus that causes COVID-19.  Institutional protocols and algorithms that pertain to the evaluation of patients at risk for COVID-19 are in a state of rapid change based on information released by regulatory bodies including the CDC and federal and state organizations. These policies and algorithms were followed during the patient's care in the ED.  Some ED evaluations and interventions may be delayed as a result of limited staffing during the pandemic.*  Note:  This document was prepared using Dragon voice recognition software and may include unintentional dictation errors.   Gregor Hams, MD 12/02/18 [phone]

## 2018-12-06 ENCOUNTER — Other Ambulatory Visit: Payer: Self-pay

## 2018-12-06 DIAGNOSIS — Z881 Allergy status to other antibiotic agents status: Secondary | ICD-10-CM | POA: Insufficient documentation

## 2018-12-06 DIAGNOSIS — R1110 Vomiting, unspecified: Secondary | ICD-10-CM | POA: Insufficient documentation

## 2018-12-06 DIAGNOSIS — K5900 Constipation, unspecified: Secondary | ICD-10-CM | POA: Insufficient documentation

## 2018-12-06 DIAGNOSIS — Z882 Allergy status to sulfonamides status: Secondary | ICD-10-CM | POA: Insufficient documentation

## 2018-12-06 DIAGNOSIS — Z880 Allergy status to penicillin: Secondary | ICD-10-CM | POA: Insufficient documentation

## 2018-12-06 DIAGNOSIS — Z79899 Other long term (current) drug therapy: Secondary | ICD-10-CM | POA: Insufficient documentation

## 2018-12-06 DIAGNOSIS — N739 Female pelvic inflammatory disease, unspecified: Secondary | ICD-10-CM | POA: Insufficient documentation

## 2018-12-06 LAB — POCT PREGNANCY, URINE: Preg Test, Ur: NEGATIVE

## 2018-12-06 NOTE — ED Triage Notes (Signed)
Pt in with co nausea for a few weeks, today at 1500 started with right abd pain. Denies any fever, no dysuria, no vomiting but did have 1 episode of diarrhea.

## 2018-12-07 ENCOUNTER — Emergency Department: Payer: Self-pay

## 2018-12-07 ENCOUNTER — Emergency Department
Admission: EM | Admit: 2018-12-07 | Discharge: 2018-12-07 | Disposition: A | Discharge status: Home / Self Care | Payer: Self-pay | Attending: Emergency Medicine | Admitting: Emergency Medicine

## 2018-12-07 DIAGNOSIS — N730 Acute parametritis and pelvic cellulitis: Secondary | ICD-10-CM

## 2018-12-07 DIAGNOSIS — K5900 Constipation, unspecified: Secondary | ICD-10-CM

## 2018-12-07 LAB — COMPREHENSIVE METABOLIC PANEL
ALT: 17 U/L (ref 0–44)
AST: 16 U/L (ref 15–41)
Albumin: 3.8 g/dL (ref 3.5–5.0)
Alkaline Phosphatase: 78 U/L (ref 38–126)
Anion gap: 7 (ref 5–15)
BUN: 10 mg/dL (ref 6–20)
CO2: 23 mmol/L (ref 22–32)
Calcium: 8.8 mg/dL — ABNORMAL LOW (ref 8.9–10.3)
Chloride: 105 mmol/L (ref 98–111)
Creatinine, Ser: 0.85 mg/dL (ref 0.44–1.00)
GFR calc Af Amer: >60 mL/min (ref >60)
GFR calc non Af Amer: >60 mL/min (ref >60)
Glucose, Bld: 112 mg/dL — ABNORMAL HIGH (ref 70–99)
Potassium: 3.7 mmol/L (ref 3.5–5.1)
Sodium: 135 mmol/L (ref 135–145)
Total Bilirubin: 0.2 mg/dL — ABNORMAL LOW (ref 0.3–1.2)
Total Protein: 7.5 g/dL (ref 6.5–8.1)

## 2018-12-07 LAB — URINALYSIS, COMPLETE (UACMP) WITH MICROSCOPIC
Bilirubin Urine: NEGATIVE
Glucose, UA: NEGATIVE mg/dL
Ketones, ur: NEGATIVE mg/dL
Nitrite: NEGATIVE
Protein, ur: NEGATIVE mg/dL
Specific Gravity, Urine: 1.012 (ref 1.005–1.030)
pH: 6 (ref 5.0–8.0)

## 2018-12-07 LAB — WET PREP, GENITAL
Clue Cells Wet Prep HPF POC: NONE SEEN
Sperm: NONE SEEN
Trich, Wet Prep: NONE SEEN
Yeast Wet Prep HPF POC: NONE SEEN

## 2018-12-07 LAB — CBC
HCT: 35.2 % — ABNORMAL LOW (ref 36.0–46.0)
Hemoglobin: 10.7 g/dL — ABNORMAL LOW (ref 12.0–15.0)
MCH: 22.1 pg — ABNORMAL LOW (ref 26.0–34.0)
MCHC: 30.4 g/dL (ref 30.0–36.0)
MCV: 72.6 fL — ABNORMAL LOW (ref 80.0–100.0)
Platelets: 380 10*3/uL (ref 150–400)
RBC: 4.85 MIL/uL (ref 3.87–5.11)
RDW: 15.1 % (ref 11.5–15.5)
WBC: 7.6 10*3/uL (ref 4.0–10.5)
nRBC: 0 % (ref 0.0–0.2)

## 2018-12-07 MED ORDER — MAGNESIUM CITRATE PO SOLN
1.0000 | Freq: Once | ORAL | Status: AC
  Administered 2018-12-07: 05:00:00 1 via ORAL
  Filled 2018-12-07: qty 296

## 2018-12-07 MED ORDER — ONDANSETRON HCL 4 MG/2ML IJ SOLN
4.0000 mg | Freq: Once | INTRAMUSCULAR | Status: AC
  Administered 2018-12-07: 4 mg via INTRAVENOUS
  Filled 2018-12-07: qty 2

## 2018-12-07 MED ORDER — DICYCLOMINE HCL 10 MG PO CAPS
10.0000 mg | ORAL_CAPSULE | Freq: Once | ORAL | Status: AC
  Administered 2018-12-07: 03:00:00 10 mg via ORAL
  Filled 2018-12-07: qty 1

## 2018-12-07 MED ORDER — IOHEXOL 300 MG/ML  SOLN
125.0000 mL | Freq: Once | INTRAMUSCULAR | Status: AC | PRN
  Administered 2018-12-07: 125 mL via INTRAVENOUS

## 2018-12-07 MED ORDER — DOXYCYCLINE MONOHYDRATE 100 MG PO TABS: 100.0000 mg | ORAL_TABLET | Freq: Two times a day (BID) | ORAL | 0 refills | Status: AC

## 2018-12-07 NOTE — ED Provider Notes (Signed)
[facility] Emergency Department Provider Note ____________   First MD Initiated Contact with Patient 12/07/18 [phone]     (approximate)  I have reviewed the triage vital signs and the nursing notes.   HISTORY  Chief Complaint Abdominal Pain   HPI Sonya Fisher is a 33 y.o. female with below listed previous medical conditions occluding recent ED visit secondary to Bartholin abscess presents emergency department secondary to a 3-week history of nausea without vomiting and generalized abdominal pain.  Patient states pain began today at 3 PM current pain score of 10 out of 10.  Patient denies any fever no dysuria or hematuria.  Patient denies any diarrhea.  Patient does admit to vaginal discharge which she states that time is "so heavy that I have to wear a pad".  Patient denies any history of any STDs.  Patient does not recall the last time she had a Pap smear.       Past Medical History:  Diagnosis Date   Anxiety    Panic attacks     There are no active problems to display for this patient.   No past surgical history on file.  Prior to Admission medications   Medication Sig Start Date End Date Taking? Authorizing Provider  dicyclomine (BENTYL) 20 MG tablet Take 1 tablet (20 mg total) by mouth 3 (three) times daily before meals. 08/15/18  Yes Lucilla Lame, MD  escitalopram (LEXAPRO) 20 MG tablet Take 20 mg by mouth daily. 07/10/18  Yes [provider]  naproxen (NAPROSYN) 500 MG tablet Take by mouth.   Yes [provider]  ondansetron (ZOFRAN ODT) 4 MG disintegrating tablet Take 1 tablet (4 mg total) by mouth every 8 (eight) hours as needed for nausea or vomiting. 06/28/18  Yes Harvest Dark, MD  oxyCODONE-acetaminophen (PERCOCET) 5-325 MG tablet Take 1 tablet by mouth every 4 (four) hours as needed. 12/02/18 12/02/19 Yes Gregor Hams, MD  clindamycin (CLEOCIN) 300 MG capsule Take 1 capsule (300 mg total) by mouth 3 (three) times  daily for 10 days. 12/02/18 12/12/18  Gregor Hams, MD  doxycycline (ADOXA) 100 MG tablet Take 1 tablet (100 mg total) by mouth 2 (two) times daily for 14 days. 12/07/18 12/21/18  Gregor Hams, MD  omeprazole (PRILOSEC) 20 MG capsule Take 20 mg by mouth daily. 06/01/18   [provider]  tiZANidine (ZANAFLEX) 4 MG tablet TAKE 1 TABLET BY MOUTH THREE TIMES DAILY AS NEEDED FOR MUSCLE SPASM UP TO FOR 30 DOSES 06/20/18   [provider]  traMADol (ULTRAM) 50 MG tablet TAKE 1 TABLET BY MOUTH EVERY 8 HOURS AS NEEDED FOR UP TO 5 DAYS 06/20/18   [provider]    Allergies Amoxicillin, Augmentin [amoxicillin-pot clavulanate], Keflex [cephalexin], Penicillins, Sulfa antibiotics, and Transact [sulfur]  No family history on file.  Social History Social History   Tobacco Use   Smoking status: Never Smoker   Smokeless tobacco: Never Used  Substance Use Topics   Alcohol use: Not Currently   Drug use: Never    Review of Systems Constitutional: No fever/chills Eyes: No visual changes. ENT: No sore throat. Cardiovascular: Denies chest pain. Respiratory: Denies shortness of breath. Gastrointestinal: No abdominal pain.  No nausea, no vomiting.  No diarrhea.  No constipation. Genitourinary: Negative for dysuria. Musculoskeletal: Negative for neck pain.  Negative for back pain. Integumentary: Negative for rash. Neurological: Negative for headaches, focal weakness or numbness.  ____________________________________________   PHYSICAL EXAM:  VITAL SIGNS: ED Triage  Vitals  Enc Vitals Group     BP 12/06/18 2342 (!) 166/89     Pulse Rate 12/06/18 2342 95     Resp 12/06/18 2342 20     Temp 12/06/18 2342 98.6 F (37 C)     Temp Source 12/06/18 2342 Oral     SpO2 12/06/18 2342 100 %     Weight 12/06/18 2343 (!) 145.2 kg (320 lb)     Height 12/06/18 2343 1.753 m (5\' 9" )     Head Circumference --      Peak Flow --      Pain Score 12/06/18 2343 10     Pain  Loc --      Pain Edu? --      Excl. in [place]? --     Constitutional: Alert and oriented.  Eyes: Conjunctivae are normal.  Head: Atraumatic Mouth/Throat: Patient is wearing a mask. Neck: No stridor.  No meningeal signs.   Cardiovascular: Normal rate, regular rhythm. Good peripheral circulation. Grossly normal heart sounds. Respiratory: Normal respiratory effort.  No retractions. Gastrointestinal: Soft and nontender. No distention.  Genitourinary: Yellowish-white vaginal discharge.  Cervical motion tenderness Musculoskeletal: No lower extremity tenderness nor edema. No gross deformities of extremities. Neurologic:  Normal speech and language. No gross focal neurologic deficits are appreciated.  Skin:  Skin is warm, dry and intact. Psychiatric: Mood and affect are normal. Speech and behavior are normal.  ____________________________________________   LABS (all labs ordered are listed, but only abnormal results are displayed)  Labs Reviewed  WET PREP, GENITAL - Abnormal; Notable for the following components:      Result Value   WBC, Wet Prep HPF POC FEW (*)    All other components within normal limits  CBC - Abnormal; Notable for the following components:   Hemoglobin 10.7 (*)    HCT 35.2 (*)    MCV 72.6 (*)    MCH 22.1 (*)    All other components within normal limits  COMPREHENSIVE METABOLIC PANEL - Abnormal; Notable for the following components:   Glucose, Bld 112 (*)    Calcium 8.8 (*)    Total Bilirubin 0.2 (*)    All other components within normal limits  URINALYSIS, COMPLETE (UACMP) WITH MICROSCOPIC - Abnormal; Notable for the following components:   Color, Urine STRAW (*)    APPearance HAZY (*)    Hgb urine dipstick MODERATE (*)    Leukocytes,Ua TRACE (*)    Bacteria, UA RARE (*)    All other components within normal limits  GC/CHLAMYDIA PROBE AMP  POC URINE PREG, ED  POCT PREGNANCY, URINE     RADIOLOGY I, [place] Caelin Rayl, personally viewed and evaluated these  images (plain radiographs) as part of my medical decision making, as well as reviewing the written report by the radiologist.  ED MD interpretation: No acute intra-abdominal/pelvic pathology on CT per radiologist.  Moderate amount of right colonic stool  Official radiology report(s): Ct Abdomen Pelvis W Contrast  Result Date: 12/07/2018 CLINICAL DATA:  Nausea a few weeks ago, right abdominal pain EXAM: CT ABDOMEN AND PELVIS WITH CONTRAST TECHNIQUE: Multidetector CT imaging of the abdomen and pelvis was performed using the standard protocol following bolus administration of intravenous contrast. CONTRAST:  125mL OMNIPAQUE IOHEXOL 300 MG/ML  SOLN COMPARISON:  July 13, 2018 FINDINGS: Lower chest: The visualized heart size within normal limits. No pericardial fluid/thickening. No hiatal hernia. The visualized portions of the lungs are clear. Hepatobiliary: The liver is normal in density without focal abnormality.The  main portal vein is patent. No evidence of calcified gallstones, gallbladder wall thickening or biliary dilatation. Pancreas: Unremarkable. No pancreatic ductal dilatation or surrounding inflammatory changes. Spleen: Normal in size without focal abnormality. Adrenals/Urinary Tract: Both adrenal glands appear normal. The kidneys and collecting system appear normal without evidence of urinary tract calculus or hydronephrosis. Bladder is unremarkable. Stomach/Bowel: The stomach, small bowel, and colon are normal in appearance. A moderate amount of right colonic stool is present. No inflammatory changes, wall thickening, or obstructive findings.The appendix is normal. Vascular/Lymphatic: There are no enlarged mesenteric, retroperitoneal, or pelvic lymph nodes. No significant vascular findings are present. Reproductive: The uterus and adnexa are unremarkable. Other: No evidence of abdominal wall mass or hernia. Musculoskeletal: No acute or significant osseous findings. IMPRESSION: No acute  intra-abdominopelvic pathology to explain the patient's symptoms. Moderate amount of right colonic stool. Electronically Signed   By: Prudencio Pair M.D.   On: 12/07/2018 03:50     Procedures   ____________________________________________   INITIAL IMPRESSION / MDM / [place] / ED COURSE  As part of my medical decision making, I reviewed the following data within the electronic MEDICAL RECORD NUMBER   33 year old female presenting with above-stated history and physical exam.  Concern for possible PID given cervical motion tenderness.  Gonorrhea chlamydia testing performed and pending.  Patient wet prep unremarkable with exception of white blood cells.  Will treat patient for PID.  Patient did not mention her above-stated symptoms when she was seen in the emergency department by me for Bartholin abscess on 12/02/2018  ____________________________________________  FINAL CLINICAL IMPRESSION(S) / ED DIAGNOSES  Final diagnoses:  Constipation, unspecified constipation type  PID (acute pelvic inflammatory disease)     MEDICATIONS GIVEN DURING THIS VISIT:  Medications  dicyclomine (BENTYL) capsule 10 mg (10 mg Oral Given 12/07/18 0313)  ondansetron (ZOFRAN) injection 4 mg (4 mg Intravenous Given 12/07/18 0313)  iohexol (OMNIPAQUE) 300 MG/ML solution 125 mL (125 mLs Intravenous Contrast Given 12/07/18 0331)  magnesium citrate solution 1 Bottle (1 Bottle Oral Given 12/07/18 0528)     ED Discharge Orders         Ordered    doxycycline (ADOXA) 100 MG tablet  2 times daily     12/07/18 0510          *Please note:  Sonya Fisher was evaluated in Emergency Department on 12/07/2018 for the symptoms described in the history of present illness. She was evaluated in the context of the global COVID-19 pandemic, which necessitated consideration that the patient might be at risk for infection with the SARS-CoV-2 virus that causes COVID-19. Institutional protocols and algorithms that pertain to  the evaluation of patients at risk for COVID-19 are in a state of rapid change based on information released by regulatory bodies including the CDC and federal and state organizations. These policies and algorithms were followed during the patient's care in the ED.  Some ED evaluations and interventions may be delayed as a result of limited staffing during the pandemic.*  Note:  This document was prepared using Dragon voice recognition software and may include unintentional dictation errors.   Gregor Hams, MD 12/07/18 [id number]

## 2018-12-07 NOTE — ED Notes (Signed)
Patient transported to CT 

## 2018-12-09 LAB — GC/CHLAMYDIA PROBE AMP
Chlamydia trachomatis, NAA: NEGATIVE
Neisseria Gonorrhoeae by PCR: NEGATIVE

## 2019-03-18 ENCOUNTER — Emergency Department: Payer: Self-pay

## 2019-03-18 ENCOUNTER — Emergency Department
Admission: EM | Admit: 2019-03-18 | Discharge: 2019-03-18 | Disposition: A | Discharge status: Home / Self Care | Payer: Self-pay | Attending: Emergency Medicine | Admitting: Emergency Medicine

## 2019-03-18 ENCOUNTER — Other Ambulatory Visit: Payer: Self-pay

## 2019-03-18 ENCOUNTER — Encounter: Payer: Self-pay | Admitting: Emergency Medicine

## 2019-03-18 DIAGNOSIS — R1030 Lower abdominal pain, unspecified: Secondary | ICD-10-CM | POA: Insufficient documentation

## 2019-03-18 DIAGNOSIS — Z79899 Other long term (current) drug therapy: Secondary | ICD-10-CM | POA: Insufficient documentation

## 2019-03-18 LAB — COMPREHENSIVE METABOLIC PANEL
ALT: 21 U/L (ref 0–44)
AST: 19 U/L (ref 15–41)
Albumin: 3.7 g/dL (ref 3.5–5.0)
Alkaline Phosphatase: 84 U/L (ref 38–126)
Anion gap: 9 (ref 5–15)
BUN: 11 mg/dL (ref 6–20)
CO2: 25 mmol/L (ref 22–32)
Calcium: 8.8 mg/dL — ABNORMAL LOW (ref 8.9–10.3)
Chloride: 104 mmol/L (ref 98–111)
Creatinine, Ser: 0.69 mg/dL (ref 0.44–1.00)
GFR calc Af Amer: >60 mL/min (ref >60)
GFR calc non Af Amer: >60 mL/min (ref >60)
Glucose, Bld: 134 mg/dL — ABNORMAL HIGH (ref 70–99)
Potassium: 3.7 mmol/L (ref 3.5–5.1)
Sodium: 138 mmol/L (ref 135–145)
Total Bilirubin: 0.5 mg/dL (ref 0.3–1.2)
Total Protein: 7.4 g/dL (ref 6.5–8.1)

## 2019-03-18 LAB — CBC
HCT: 38 % (ref 36.0–46.0)
Hemoglobin: 11.3 g/dL — ABNORMAL LOW (ref 12.0–15.0)
MCH: 21.9 pg — ABNORMAL LOW (ref 26.0–34.0)
MCHC: 29.7 g/dL — ABNORMAL LOW (ref 30.0–36.0)
MCV: 73.6 fL — ABNORMAL LOW (ref 80.0–100.0)
Platelets: 381 10*3/uL (ref 150–400)
RBC: 5.16 MIL/uL — ABNORMAL HIGH (ref 3.87–5.11)
RDW: 16.1 % — ABNORMAL HIGH (ref 11.5–15.5)
WBC: 6.1 10*3/uL (ref 4.0–10.5)
nRBC: 0 % (ref 0.0–0.2)

## 2019-03-18 LAB — URINALYSIS, COMPLETE (UACMP) WITH MICROSCOPIC
Bilirubin Urine: NEGATIVE
Glucose, UA: NEGATIVE mg/dL
Ketones, ur: NEGATIVE mg/dL
Nitrite: NEGATIVE
Protein, ur: NEGATIVE mg/dL
Specific Gravity, Urine: 1.019 (ref 1.005–1.030)
pH: 6 (ref 5.0–8.0)

## 2019-03-18 LAB — HCG, QUANTITATIVE, PREGNANCY: hCG, Beta Chain, Quant, S: <1 m[IU]/mL (ref <5)

## 2019-03-18 LAB — POCT PREGNANCY, URINE: Preg Test, Ur: NEGATIVE

## 2019-03-18 LAB — LIPASE, BLOOD: Lipase: 25 U/L (ref 11–51)

## 2019-03-18 MED ORDER — KETOROLAC TROMETHAMINE 30 MG/ML IJ SOLN
60.0000 mg | Freq: Once | INTRAMUSCULAR | Status: AC
  Administered 2019-03-18: 60 mg via INTRAMUSCULAR
  Filled 2019-03-18: qty 2

## 2019-03-18 MED ORDER — METOCLOPRAMIDE HCL 5 MG PO TABS: 5.0000 mg | ORAL_TABLET | Freq: Three times a day (TID) | ORAL | 0 refills | Status: AC | PRN

## 2019-03-18 MED ORDER — IOHEXOL 350 MG/ML SOLN
100.0000 mL | Freq: Once | INTRAVENOUS | Status: AC | PRN
  Administered 2019-03-18: 100 mL via INTRAVENOUS
  Filled 2019-03-18: qty 100

## 2019-03-18 MED ORDER — DICYCLOMINE HCL 10 MG PO CAPS: 20.0000 mg | ORAL_CAPSULE | Freq: Three times a day (TID) | ORAL | 0 refills | Status: DC

## 2019-03-18 MED ORDER — METOCLOPRAMIDE HCL 10 MG PO TABS
10.0000 mg | ORAL_TABLET | Freq: Once | ORAL | Status: AC
  Administered 2019-03-18: 10 mg via ORAL
  Filled 2019-03-18: qty 1

## 2019-03-18 MED ORDER — DICYCLOMINE HCL 10 MG PO CAPS
20.0000 mg | ORAL_CAPSULE | Freq: Once | ORAL | Status: AC
  Administered 2019-03-18: 20 mg via ORAL
  Filled 2019-03-18: qty 2

## 2019-03-18 NOTE — ED Triage Notes (Signed)
Pt c/o right side abdominal pain mostly lower/mid.  Pain started 30 min ago 10/10.  Denies pain in back at this time.  No history of kidney stones. Nausea without vomiting. No known fever.  Unsure if pregnant, should be starting period today or tomorrow.

## 2019-03-18 NOTE — ED Notes (Signed)
Pt reports right side abd pain  Pt has nausea.  No vomiting today.  Pt reports vomiting last week.  Denies urinary sx.  Pt also has lower back pain.  No known injury.  Pt alert.

## 2019-03-18 NOTE — Discharge Instructions (Signed)
Your exam, labs, and CT scan are all normal and reassuring. There is no indication of pregnancy, appendicitis, or bowel obstruction. You should continue to take your previously prescribed meds. Follow-up with Dr. Allen Norris, for continued symptoms. Return to the ED as needed.

## 2019-03-18 NOTE — ED Provider Notes (Signed)
[facility] Emergency Department Provider Note ____________________________________________  Time seen: 1815  I have reviewed the triage vital signs and the nursing notes.  HISTORY  Chief Complaint  Abdominal Pain   HPI Sonya Fisher is a 34 y.o. female reports right upper/lower abdominal pain. Reports onset after eating lunch this afternoon. Reports onset of cramping pain, nausea, and vomiting about 15 minutes after eating shrimp, hushpuppies, and fries. She has noted intermittent episodes of both diarrhea and constipation. Patient is currently under the care of GI medicine for IBS.  She reports pain in poorly compliant with medications as of late.  She denies any interim fevers, chills, or sweats.  She reports nonbloody, nonbilious vomitus today.  She is presenting to the ED for further evaluation of her symptoms.  She also raised some concern for possible ectopic pregnancy, noting she has had negative pregnancy test but an irregularly short/scant menses last month.  Past Medical History:  Diagnosis Date  . Anxiety   . Panic attacks     There are no problems to display for this patient.   History reviewed. No pertinent surgical history.  Prior to Admission medications   Medication Sig Start Date End Date Taking? Authorizing Provider  dicyclomine (BENTYL) 10 MG capsule Take 2 capsules (20 mg total) by mouth 3 (three) times daily before meals for 10 days. 03/18/19 03/28/19  Gerald Honea, Dannielle Karvonen, PA-C  dicyclomine (BENTYL) 20 MG tablet Take 1 tablet (20 mg total) by mouth 3 (three) times daily before meals. 08/15/18   Lucilla Lame, MD  escitalopram (LEXAPRO) 20 MG tablet Take 20 mg by mouth daily. 07/10/18   [provider]  metoCLOPramide (REGLAN) 5 MG tablet Take 1 tablet (5 mg total) by mouth every 8 (eight) hours as needed for up to 10 days for nausea or vomiting. 03/18/19 03/28/19  Jolinda Pinkstaff, Dannielle Karvonen, PA-C  naproxen (NAPROSYN) 500 MG tablet Take by  mouth.    [provider]  omeprazole (PRILOSEC) 20 MG capsule Take 20 mg by mouth daily. 06/01/18   [provider]  ondansetron (ZOFRAN ODT) 4 MG disintegrating tablet Take 1 tablet (4 mg total) by mouth every 8 (eight) hours as needed for nausea or vomiting. 06/28/18   Harvest Dark, MD  oxyCODONE-acetaminophen (PERCOCET) 5-325 MG tablet Take 1 tablet by mouth every 4 (four) hours as needed. 12/02/18 12/02/19  Gregor Hams, MD  tiZANidine (ZANAFLEX) 4 MG tablet TAKE 1 TABLET BY MOUTH THREE TIMES DAILY AS NEEDED FOR MUSCLE SPASM UP TO FOR 30 DOSES 06/20/18   [provider]  traMADol (ULTRAM) 50 MG tablet TAKE 1 TABLET BY MOUTH EVERY 8 HOURS AS NEEDED FOR UP TO 5 DAYS 06/20/18   [provider]    Allergies Augmentin [amoxicillin-pot clavulanate], Sulfa antibiotics, Transact [sulfur], Amoxicillin, Keflex [cephalexin], and Penicillins  History reviewed. No pertinent family history.  Social History Social History   Tobacco Use  . Smoking status: Never Smoker  . Smokeless tobacco: Never Used  Substance Use Topics  . Alcohol use: Not Currently  . Drug use: Never    Review of Systems  Constitutional: Negative for fever. Cardiovascular: Negative for chest pain. Respiratory: Negative for shortness of breath. Gastrointestinal: Positive for abdominal pain, vomiting and diarrhea. Genitourinary: Negative for dysuria. Musculoskeletal: Negative for back pain. Skin: Negative for rash. Neurological: Negative for headaches, focal weakness or numbness. ____________________________________________  PHYSICAL EXAM:  VITAL SIGNS: ED Triage Vitals  Enc Vitals Group     BP 03/18/19 1542 (!)  145/76     Pulse Rate 03/18/19 1542 99     Resp 03/18/19 1542 20     Temp 03/18/19 1542 97.7 F (36.5 C)     Temp Source 03/18/19 1542 Oral     SpO2 03/18/19 1542 100 %     Weight 03/18/19 1543 (!) 362 lb (164.2 kg)     Height 03/18/19 1543 5\' 9"  (1.753 m)      Head Circumference --      Peak Flow --      Pain Score 03/18/19 1543 10     Pain Loc --      Pain Edu? --      Excl. in [place]? --     Constitutional: Alert and oriented. Well appearing and in no distress. Head: Normocephalic and atraumatic. Eyes: Conjunctivae are normal. Normal extraocular movements Cardiovascular: Normal rate, regular rhythm. Normal distal pulses. Respiratory: Normal respiratory effort. No wheezes/rales/rhonchi. Gastrointestinal: Soft and nontender. No distention, rebound, guarding, or rigidity.  No organomegaly appreciated.  Patient localizes pain to the right upper and lower abdominal regions.  No CVA tenderness or flank pain is elicited. Musculoskeletal: Nontender with normal range of motion in all extremities.  Neurologic:  Normal gait without ataxia. Normal speech and language. No gross focal neurologic deficits are appreciated. Skin:  Skin is warm, dry and intact. No rash noted. Psychiatric: Mood and affect are normal. Patient exhibits appropriate insight and judgment. ____________________________________________   LABS (pertinent positives/negatives) Labs Reviewed  COMPREHENSIVE METABOLIC PANEL - Abnormal; Notable for the following components:      Result Value   Glucose, Bld 134 (*)    Calcium 8.8 (*)    All other components within normal limits  CBC - Abnormal; Notable for the following components:   RBC 5.16 (*)    Hemoglobin 11.3 (*)    MCV 73.6 (*)    MCH 21.9 (*)    MCHC 29.7 (*)    RDW 16.1 (*)    All other components within normal limits  URINALYSIS, COMPLETE (UACMP) WITH MICROSCOPIC - Abnormal; Notable for the following components:   Color, Urine YELLOW (*)    APPearance HAZY (*)    Hgb urine dipstick SMALL (*)    Leukocytes,Ua TRACE (*)    Bacteria, UA RARE (*)    All other components within normal limits  LIPASE, BLOOD  HCG, QUANTITATIVE, PREGNANCY  POC URINE PREG, ED  POCT PREGNANCY, URINE  ____________________________________________    RADIOLOGY  CT ABD/Pelvis w/ CM IMPRESSION: 1. A 2.5 cm dominant left ovarian follicle/corpus luteum. 2. No bowel obstruction or active inflammation.  Normal appendix. 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 4. Fatty liver. ____________________________________________  PROCEDURES  Toradol 60 mg IM Bentyl 20 mg PO Reglan 10 gm PO  Procedures ____________________________________________  INITIAL IMPRESSION / ASSESSMENT AND PLAN / ED COURSE  Differential diagnosis includes, but is not limited to, ovarian cyst, ovarian torsion, acute appendicitis, diverticulitis, urinary tract infection/pyelonephritis, endometriosis, bowel obstruction, colitis, renal colic, gastroenteritis, hernia, fibroids, endometriosis, pregnancy related pain including ectopic pregnancy, etc.  Female patient with ED evaluation of sudden onset of right upper and lower abdominal pain about 20 minutes after eating.  She describes a single episode of nonbloody, nonbilious emesis followed by ongoing abdominal cramping.  She is otherwise stable on exam.  No intractable vomiting during her course in the ED.  Labs are reassuring as it showed no acute leukocytosis, or elevation in her lipase, or LFTs. Patient has stable anemia, and no signs of  leukocyturia.  Pregnancy is ruled out with both blood and urine pregnancy test.  CT scan is negative for any acute findings to account for the patient's generalized RUQ/RLQ abdominal pain. She will follow-up with Dr. Allen Norris for ongoing care. Prescriptions for Reglan and Bentyl are provided.   Sonya Fisher was evaluated in Emergency Department on 03/18/2019 for the symptoms described in the history of present illness. She was evaluated in the context of the global COVID-19 pandemic, which necessitated consideration that the patient might be at risk for infection with the SARS-CoV-2 virus that causes COVID-19. Institutional protocols and algorithms that pertain to the evaluation of patients at  risk for COVID-19 are in a state of rapid change based on information released by regulatory bodies including the CDC and federal and state organizations. These policies and algorithms were followed during the patient's care in the ED. ____________________________________________  FINAL CLINICAL IMPRESSION(S) / ED DIAGNOSES  Final diagnoses:  Lower abdominal pain      Luisenrique Conran, Dannielle Karvonen, PA-C 03/18/19 2136    Vanessa [place], MD 03/21/19 1216

## 2020-05-03 ENCOUNTER — Other Ambulatory Visit: Payer: Self-pay

## 2020-05-03 ENCOUNTER — Emergency Department: Payer: Self-pay

## 2020-05-03 ENCOUNTER — Emergency Department
Admission: EM | Admit: 2020-05-03 | Discharge: 2020-05-03 | Disposition: A | Discharge status: Home / Self Care | Payer: Self-pay | Attending: Emergency Medicine | Admitting: Emergency Medicine

## 2020-05-03 ENCOUNTER — Encounter: Payer: Self-pay | Admitting: Emergency Medicine

## 2020-05-03 DIAGNOSIS — R1030 Lower abdominal pain, unspecified: Secondary | ICD-10-CM | POA: Insufficient documentation

## 2020-05-03 DIAGNOSIS — R300 Dysuria: Secondary | ICD-10-CM | POA: Insufficient documentation

## 2020-05-03 LAB — COMPREHENSIVE METABOLIC PANEL
ALT: 13 U/L (ref 0–44)
AST: 14 U/L — ABNORMAL LOW (ref 15–41)
Albumin: 3.5 g/dL (ref 3.5–5.0)
Alkaline Phosphatase: 90 U/L (ref 38–126)
Anion gap: 7 (ref 5–15)
BUN: 16 mg/dL (ref 6–20)
CO2: 25 mmol/L (ref 22–32)
Calcium: 8.9 mg/dL (ref 8.9–10.3)
Chloride: 102 mmol/L (ref 98–111)
Creatinine, Ser: 0.71 mg/dL (ref 0.44–1.00)
GFR, Estimated: >60 mL/min (ref >60)
Glucose, Bld: 128 mg/dL — ABNORMAL HIGH (ref 70–99)
Potassium: 3.6 mmol/L (ref 3.5–5.1)
Sodium: 134 mmol/L — ABNORMAL LOW (ref 135–145)
Total Bilirubin: 0.4 mg/dL (ref 0.3–1.2)
Total Protein: 7.3 g/dL (ref 6.5–8.1)

## 2020-05-03 LAB — CBC
HCT: 33 % — ABNORMAL LOW (ref 36.0–46.0)
Hemoglobin: 9.6 g/dL — ABNORMAL LOW (ref 12.0–15.0)
MCH: 19.8 pg — ABNORMAL LOW (ref 26.0–34.0)
MCHC: 29.1 g/dL — ABNORMAL LOW (ref 30.0–36.0)
MCV: 67.9 fL — ABNORMAL LOW (ref 80.0–100.0)
Platelets: 389 10*3/uL (ref 150–400)
RBC: 4.86 MIL/uL (ref 3.87–5.11)
RDW: 17.4 % — ABNORMAL HIGH (ref 11.5–15.5)
WBC: 8 10*3/uL (ref 4.0–10.5)
nRBC: 0 % (ref 0.0–0.2)

## 2020-05-03 LAB — URINALYSIS, COMPLETE (UACMP) WITH MICROSCOPIC
Bacteria, UA: NONE SEEN
Bilirubin Urine: NEGATIVE
Glucose, UA: NEGATIVE mg/dL
Ketones, ur: NEGATIVE mg/dL
Nitrite: NEGATIVE
Protein, ur: 30 mg/dL — AB
Specific Gravity, Urine: 1.024 (ref 1.005–1.030)
pH: 5 (ref 5.0–8.0)

## 2020-05-03 LAB — HCG, QUANTITATIVE, PREGNANCY: hCG, Beta Chain, Quant, S: <1 m[IU]/mL (ref <5)

## 2020-05-03 LAB — LIPASE, BLOOD: Lipase: 29 U/L (ref 11–51)

## 2020-05-03 MED ORDER — ONDANSETRON HCL 4 MG/2ML IJ SOLN
4.0000 mg | Freq: Once | INTRAMUSCULAR | Status: AC
  Administered 2020-05-03: 4 mg via INTRAVENOUS
  Filled 2020-05-03: qty 2

## 2020-05-03 MED ORDER — NITROFURANTOIN MONOHYD MACRO 100 MG PO CAPS: 100.0000 mg | ORAL_CAPSULE | Freq: Two times a day (BID) | ORAL | 0 refills | Status: AC

## 2020-05-03 MED ORDER — LACTATED RINGERS IV BOLUS
1000.0000 mL | Freq: Once | INTRAVENOUS | Status: AC
  Administered 2020-05-03: 1000 mL via INTRAVENOUS

## 2020-05-03 MED ORDER — ONDANSETRON 4 MG PO TBDP: 4.0000 mg | ORAL_TABLET | Freq: Three times a day (TID) | ORAL | 0 refills | Status: DC | PRN

## 2020-05-03 MED ORDER — NITROFURANTOIN MONOHYD MACRO 100 MG PO CAPS
100.0000 mg | ORAL_CAPSULE | Freq: Once | ORAL | Status: AC
  Administered 2020-05-03: 100 mg via ORAL
  Filled 2020-05-03: qty 1

## 2020-05-03 MED ORDER — IOHEXOL 300 MG/ML  SOLN
125.0000 mL | Freq: Once | INTRAMUSCULAR | Status: AC | PRN
  Administered 2020-05-03: 125 mL via INTRAVENOUS

## 2020-05-03 MED ORDER — KETOROLAC TROMETHAMINE 30 MG/ML IJ SOLN
15.0000 mg | Freq: Once | INTRAMUSCULAR | Status: AC
  Administered 2020-05-03: 15 mg via INTRAVENOUS
  Filled 2020-05-03: qty 1

## 2020-05-03 NOTE — ED Triage Notes (Signed)
Pt reports lower abdominal pain for the past hr. Reports diarrhea, and burning sensation with urination. Pt talks in complete sentences no respiratory distress noted

## 2020-05-03 NOTE — ED Provider Notes (Signed)
[facility] Emergency Department Provider Note  ____________________________________________  Time seen: Approximately 2:01 AM  I have reviewed the triage vital signs and the nursing notes.   HISTORY  Chief Complaint Abdominal Pain   HPI Sonya Fisher is a 35 y.o. female with a history of anxiety, IBS, panic attacks who presents for evaluation of abdominal pain.   Patient reports 2 to 3 days of dysuria and today started having abdominal pain that she describes as sharp, located in the lower quadrants of her abdomen and radiating to the right flank.  She has had nausea and one episode of nonbloody nonbilious emesis.  No fever or chills.  She denies constipation or diarrhea, chest pain or shortness of breath, cough or congestion.  No prior abdominal surgeries. LMP was 4 weeks ago. No vaginal discharge, patient not sexually active.   Past Medical History:  Diagnosis Date  . Anxiety   . Panic attacks      Prior to Admission medications   Medication Sig Start Date End Date Taking? Authorizing Provider  nitrofurantoin, macrocrystal-monohydrate, (MACROBID) 100 MG capsule Take 1 capsule (100 mg total) by mouth 2 (two) times daily for 5 days. 05/03/20 05/08/20 Yes Jerman Tinnon, [place], MD  ondansetron (ZOFRAN ODT) 4 MG disintegrating tablet Take 1 tablet (4 mg total) by mouth every 8 (eight) hours as needed. 05/03/20  Yes Alfred Levins, [place], MD  dicyclomine (BENTYL) 10 MG capsule Take 2 capsules (20 mg total) by mouth 3 (three) times daily before meals for 10 days. 03/18/19 03/28/19  Menshew, Dannielle Karvonen, PA-C  dicyclomine (BENTYL) 20 MG tablet Take 1 tablet (20 mg total) by mouth 3 (three) times daily before meals. 08/15/18   Lucilla Lame, MD  escitalopram (LEXAPRO) 20 MG tablet Take 20 mg by mouth daily. 07/10/18   [provider]  metoCLOPramide (REGLAN) 5 MG tablet Take 1 tablet (5 mg total) by mouth every 8 (eight) hours as needed for up to 10 days for nausea or  vomiting. 03/18/19 03/28/19  Menshew, Dannielle Karvonen, PA-C  naproxen (NAPROSYN) 500 MG tablet Take by mouth.    [provider]  omeprazole (PRILOSEC) 20 MG capsule Take 20 mg by mouth daily. 06/01/18   [provider]  tiZANidine (ZANAFLEX) 4 MG tablet TAKE 1 TABLET BY MOUTH THREE TIMES DAILY AS NEEDED FOR MUSCLE SPASM UP TO FOR 30 DOSES 06/20/18   [provider]  traMADol (ULTRAM) 50 MG tablet TAKE 1 TABLET BY MOUTH EVERY 8 HOURS AS NEEDED FOR UP TO 5 DAYS 06/20/18   [provider]    Allergies Augmentin [amoxicillin-pot clavulanate], Sulfa antibiotics, Transact [elemental sulfur], Amoxicillin, Keflex [cephalexin], and Penicillins  No family history on file.  Social History Social History   Tobacco Use  . Smoking status: Never Smoker  . Smokeless tobacco: Never Used  Substance Use Topics  . Alcohol use: Not Currently  . Drug use: Never    Review of Systems  Constitutional: Negative for fever. Eyes: Negative for visual changes. ENT: Negative for sore throat. Neck: No neck pain  Cardiovascular: Negative for chest pain. Respiratory: Negative for shortness of breath. Gastrointestinal: + abdominal pain, nausea, and vomiting. No diarrhea. Genitourinary: + dysuria. Musculoskeletal: Negative for back pain. Skin: Negative for rash. Neurological: Negative for headaches, weakness or numbness. Psych: No SI or HI  ____________________________________________   PHYSICAL EXAM:  VITAL SIGNS: ED Triage Vitals  Enc Vitals Group     BP 05/03/20 0058 (!) 128/59     Pulse  Rate 05/03/20 0058 87     Resp 05/03/20 0058 18     Temp 05/03/20 0058 98.3 F (36.8 C)     Temp Source 05/03/20 0058 Oral     SpO2 05/03/20 0058 97 %     Weight 05/03/20 0058 (!) 352 lb (159.7 kg)     Height 05/03/20 0058 5\' 9"  (1.753 m)     Head Circumference --      Peak Flow --      Pain Score 05/03/20 0109 9     Pain Loc --      Pain Edu? --      Excl. in [place]? --      Constitutional: Alert and oriented. Well appearing and in no apparent distress. HEENT:      Head: Normocephalic and atraumatic.         Eyes: Conjunctivae are normal. Sclera is non-icteric.       Mouth/Throat: Mucous membranes are moist.       Neck: Supple with no signs of meningismus. Cardiovascular: Regular rate and rhythm. No murmurs, gallops, or rubs. 2+ symmetrical distal pulses are present in all extremities. No JVD. Respiratory: Normal respiratory effort. Lungs are clear to auscultation bilaterally.  Gastrointestinal: Soft, diffusely tender to palpation in the lower quadrants, and non distended with positive bowel sounds. No rebound or guarding. Genitourinary: R CVA tenderness. Musculoskeletal:  No edema, cyanosis, or erythema of extremities. Neurologic: Normal speech and language. Face is symmetric. Moving all extremities. No gross focal neurologic deficits are appreciated. Skin: Skin is warm, dry and intact. No rash noted. Psychiatric: Mood and affect are normal. Speech and behavior are normal.  ____________________________________________   LABS (all labs ordered are listed, but only abnormal results are displayed)  Labs Reviewed  COMPREHENSIVE METABOLIC PANEL - Abnormal; Notable for the following components:      Result Value   Sodium 134 (*)    Glucose, Bld 128 (*)    AST 14 (*)    All other components within normal limits  CBC - Abnormal; Notable for the following components:   Hemoglobin 9.6 (*)    HCT 33.0 (*)    MCV 67.9 (*)    MCH 19.8 (*)    MCHC 29.1 (*)    RDW 17.4 (*)    All other components within normal limits  URINALYSIS, COMPLETE (UACMP) WITH MICROSCOPIC - Abnormal; Notable for the following components:   Color, Urine YELLOW (*)    APPearance HAZY (*)    Hgb urine dipstick MODERATE (*)    Protein, ur 30 (*)    Leukocytes,Ua TRACE (*)    All other components within normal limits  URINE CULTURE  LIPASE, BLOOD  HCG, QUANTITATIVE, PREGNANCY    ____________________________________________  EKG  none  ____________________________________________  RADIOLOGY  I have personally reviewed the images performed during this visit and I agree with the Radiologist's read.   Interpretation by Radiologist:  CT ABDOMEN PELVIS W CONTRAST  Result Date: 05/03/2020 CLINICAL DATA:  Lower abdominal pain for the past hour, diarrhea and burning sensation with urination EXAM: CT ABDOMEN AND PELVIS WITH CONTRAST TECHNIQUE: Multidetector CT imaging of the abdomen and pelvis was performed using the standard protocol following bolus administration of intravenous contrast. CONTRAST:  164mL OMNIPAQUE IOHEXOL 300 MG/ML  SOLN COMPARISON:  CT 03/18/2019 FINDINGS: Lower chest: Lung bases are clear. Normal heart size. No pericardial effusion. Hepatobiliary: Diffuse hepatic hypoattenuation compatible with hepatic steatosis. No worrisome focal liver lesions. Smooth liver surface contour. Normal hepatic attenuation.  Normal gallbladder and biliary tree. Pancreas: No pancreatic ductal dilatation or surrounding inflammatory changes. Spleen: Normal in size. No concerning splenic lesions. Adrenals/Urinary Tract: Normal adrenals. Kidneys are normally located with symmetric enhancement. No suspicious renal lesion, urolithiasis or hydronephrosis. No gross bladder abnormality accounting for degree of distension. Stomach/Bowel: Distal esophagus and stomach are unremarkable. Congenital small malrotation of the small bowel with the duodenum failing to cross midline. No small bowel thickening or dilatation. Appendix is not visualized. No focal inflammation the vicinity of the cecum to suggest an occult appendicitis. No colonic dilatation or wall thickening. Vascular/Lymphatic: No significant vascular findings are present. No enlarged abdominal or pelvic lymph nodes. Reproductive: Anteverted uterus. No concerning adnexal masses or lesions. Other: No abdominopelvic free fluid or free gas.  No bowel containing hernias. Mild posterior body wall edema. Musculoskeletal: No acute osseous abnormality or suspicious osseous lesion. IMPRESSION: 1. No acute abdominopelvic abnormality to provide cause for patient's symptoms. Specifically no evidence of urolithiasis, hydronephrosis or other acute urinary tract abnormality. 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 3. Hepatic steatosis. 4. Nonvisualization of the appendix without CT features to suggest an occult appendicitis. Electronically Signed   By: Lovena Le M.D.   On: 05/03/2020 03:23      ____________________________________________   PROCEDURES  Procedure(s) performed: None Procedures Critical Care performed:  None ____________________________________________   INITIAL IMPRESSION / ASSESSMENT AND PLAN / ED COURSE  35 y.o. female with a history of anxiety, IBS, panic attacks who presents for evaluation of abdominal pain.  Patient due to 3 days of dysuria and now lower abdominal pain and right flank pain.  She is well-appearing in no distress with normal vital signs, abdomen is tender to palpation of the lower quadrants and right flank.  Differential diagnosis including UTI versus pyelonephritis versus kidney stone versus appendicitis versus diverticulitis versus gallbladder pathology versus PID versus STD versus pregnancy versus ectopic  Labs showing mild anemia which patient has a history of due to heavy menses.  No leukocytosis, no thrombocytosis, normal LFTs and lipase, no significant electrolyte derangements.  UA with some blood but no signs of UTI.  We will send it for culture.  hCG negative.  Will give IV Toradol, Zofran and fluids.  We will send patient for CT abdomen pelvis.  Old medical records reviewed showing several prior presentations for abdominal pain with 4 CTs since 2020 all negative.    _________________________ 3:28 AM on  05/03/2020 -----------------------------------------  CT visualized by me with no acute findings, confirmed by radiology.  Since patient has been symptomatic with dysuria for the last few days and her urine does seem to have trace leuks and 6-10 WBCs will treat with Macrobid until culture is back.  Discussed possibility of STD causing dysuria but patient denies vaginal discharge, reports that she has not been sexually active for a while.  And declines pelvic exam.  After Toradol and Zofran she feels markedly improved, abdomen is soft and nontender at this time.  Will discharge home on supportive care and close follow-up with PCP.  Discussed my standard return precautions.  _____________________________________________ Please note:  Patient was evaluated in Emergency Department today for the symptoms described in the history of present illness. Patient was evaluated in the context of the global COVID-19 pandemic, which necessitated consideration that the patient might be at risk for infection with the SARS-CoV-2 virus that causes COVID-19. Institutional protocols and algorithms that pertain to the evaluation of patients at risk for  COVID-19 are in a state of rapid change based on information released by regulatory bodies including the CDC and federal and state organizations. These policies and algorithms were followed during the patient's care in the ED.  Some ED evaluations and interventions may be delayed as a result of limited staffing during the pandemic.   [place] Controlled Substance Database was reviewed by me. ____________________________________________   FINAL CLINICAL IMPRESSION(S) / ED DIAGNOSES   Final diagnoses:  Lower abdominal pain  Dysuria      NEW MEDICATIONS STARTED DURING THIS VISIT:  ED Discharge Orders         Ordered    nitrofurantoin, macrocrystal-monohydrate, (MACROBID) 100 MG capsule  2 times daily        05/03/20 0330    ondansetron (ZOFRAN ODT) 4 MG disintegrating  tablet  Every 8 hours PRN        05/03/20 0330           Note:  This document was prepared using Dragon voice recognition software and may include unintentional dictation errors.    Rudene Re, MD 05/03/20 [phone]

## 2020-05-03 NOTE — Discharge Instructions (Addendum)

## 2020-05-04 LAB — URINE CULTURE

## 2020-05-07 ENCOUNTER — Telehealth: Payer: Self-pay

## 2020-05-07 NOTE — Telephone Encounter (Signed)
After receiving an ER referral, called pt. LVM  MD 05/07/20 @ 2:06 pm

## 2020-10-15 ENCOUNTER — Emergency Department
Admission: EM | Admit: 2020-10-15 | Discharge: 2020-10-15 | Disposition: A | Discharge status: Home / Self Care | Payer: Self-pay | Attending: Emergency Medicine | Admitting: Emergency Medicine

## 2020-10-15 ENCOUNTER — Other Ambulatory Visit: Payer: Self-pay

## 2020-10-15 ENCOUNTER — Encounter: Payer: Self-pay | Admitting: Emergency Medicine

## 2020-10-15 ENCOUNTER — Emergency Department: Payer: Self-pay

## 2020-10-15 DIAGNOSIS — Z79899 Other long term (current) drug therapy: Secondary | ICD-10-CM | POA: Insufficient documentation

## 2020-10-15 DIAGNOSIS — Z20822 Contact with and (suspected) exposure to covid-19: Secondary | ICD-10-CM | POA: Insufficient documentation

## 2020-10-15 DIAGNOSIS — J069 Acute upper respiratory infection, unspecified: Secondary | ICD-10-CM | POA: Insufficient documentation

## 2020-10-15 LAB — BASIC METABOLIC PANEL
Anion gap: 9 (ref 5–15)
BUN: 10 mg/dL (ref 6–20)
CO2: 24 mmol/L (ref 22–32)
Calcium: 8.8 mg/dL — ABNORMAL LOW (ref 8.9–10.3)
Chloride: 97 mmol/L — ABNORMAL LOW (ref 98–111)
Creatinine, Ser: 0.81 mg/dL (ref 0.44–1.00)
GFR, Estimated: >60 mL/min (ref >60)
Glucose, Bld: 120 mg/dL — ABNORMAL HIGH (ref 70–99)
Potassium: 3.6 mmol/L (ref 3.5–5.1)
Sodium: 130 mmol/L — ABNORMAL LOW (ref 135–145)

## 2020-10-15 LAB — RESP PANEL BY RT-PCR (FLU A&B, COVID) ARPGX2
Influenza A by PCR: NEGATIVE
Influenza B by PCR: NEGATIVE
SARS Coronavirus 2 by RT PCR: NEGATIVE

## 2020-10-15 LAB — TROPONIN I (HIGH SENSITIVITY): Troponin I (High Sensitivity): 3 ng/L (ref <18)

## 2020-10-15 LAB — CBC
HCT: 31.2 % — ABNORMAL LOW (ref 36.0–46.0)
Hemoglobin: 9.1 g/dL — ABNORMAL LOW (ref 12.0–15.0)
MCH: 19.5 pg — ABNORMAL LOW (ref 26.0–34.0)
MCHC: 29.2 g/dL — ABNORMAL LOW (ref 30.0–36.0)
MCV: 66.8 fL — ABNORMAL LOW (ref 80.0–100.0)
Platelets: 452 10*3/uL — ABNORMAL HIGH (ref 150–400)
RBC: 4.67 MIL/uL (ref 3.87–5.11)
RDW: 17.8 % — ABNORMAL HIGH (ref 11.5–15.5)
WBC: 11.4 10*3/uL — ABNORMAL HIGH (ref 4.0–10.5)
nRBC: 0 % (ref 0.0–0.2)

## 2020-10-15 MED ORDER — DOXYCYCLINE HYCLATE 100 MG PO CAPS: 100.0000 mg | ORAL_CAPSULE | Freq: Two times a day (BID) | ORAL | 0 refills | Status: AC

## 2020-10-15 MED ORDER — IPRATROPIUM-ALBUTEROL 0.5-2.5 (3) MG/3ML IN SOLN
3.0000 mL | Freq: Once | RESPIRATORY_TRACT | Status: AC
  Administered 2020-10-15: 3 mL via RESPIRATORY_TRACT
  Filled 2020-10-15: qty 3

## 2020-10-15 MED ORDER — ALBUTEROL SULFATE HFA 108 (90 BASE) MCG/ACT IN AERS: 2.0000 | INHALATION_SPRAY | Freq: Four times a day (QID) | RESPIRATORY_TRACT | 0 refills | Status: DC | PRN

## 2020-10-15 MED ORDER — HYDROCODONE-ACETAMINOPHEN 5-325 MG PO TABS
1.0000 | ORAL_TABLET | Freq: Once | ORAL | Status: AC
  Administered 2020-10-15: 1 via ORAL
  Filled 2020-10-15: qty 1

## 2020-10-15 MED ORDER — PREDNISONE 20 MG PO TABS
60.0000 mg | ORAL_TABLET | Freq: Once | ORAL | Status: AC
  Administered 2020-10-15: 60 mg via ORAL
  Filled 2020-10-15: qty 3

## 2020-10-15 MED ORDER — PREDNISONE 20 MG PO TABS: 40.0000 mg | ORAL_TABLET | Freq: Every day | ORAL | 0 refills | Status: AC

## 2020-10-15 MED ORDER — HYDROCODONE BIT-HOMATROP MBR 5-1.5 MG/5ML PO SOLN
5.0000 mL | Freq: Four times a day (QID) | ORAL | 0 refills | Status: DC | PRN
Start: 2020-10-15 — End: 2021-10-28

## 2020-10-15 NOTE — ED Triage Notes (Signed)
Pt to ED from home c/o chest pain for several days with green productive cough, SOB, denies n/v/d or fever or chills.  States pain in chest is worse with coughing.  Pt A&Ox4, chest rise even and unlabored, dry cough noted in triage, in NAD at this time.

## 2020-10-15 NOTE — ED Provider Notes (Signed)
[facility] Emergency Department Provider Note  ____________________________________________   Event Date/Time   First MD Initiated Contact with Patient 10/15/20 0226     (approximate)  I have reviewed the triage vital signs and the nursing notes.   HISTORY  Chief Complaint Chest Pain    HPI Sonya Fisher is a 35 y.o. female here with cough, chest pain, congestion.  The patient states that for the last week, she has had nasal congestion, dry cough, and wheezing.  She states that over the last week, her cough has persisted and has become productive of yellow-green sputum.  She has had some associated chest pain after coughing, but this is only after coughing.  Denies any pain with inspiration.  She has had general fatigue, chills.  Her husband is here with similar symptoms.  She has had some mild shortness of breath during her coughing spells but this then resolves.  She has a history of recurrent URIs and has required breathing treatments in the past.  Denies formal diagnosis of asthma.  No leg swelling.  No history of DVT or PE.    Past Medical History:  Diagnosis Date   Anxiety    Panic attacks     There are no problems to display for this patient.   History reviewed. No pertinent surgical history.  Prior to Admission medications   Medication Sig Start Date End Date Taking? Authorizing Provider  albuterol (VENTOLIN HFA) 108 (90 Base) MCG/ACT inhaler Inhale 2 puffs into the lungs every 6 (six) hours as needed for wheezing or shortness of breath. 10/15/20  Yes Duffy Bruce, MD  doxycycline (VIBRAMYCIN) 100 MG capsule Take 1 capsule (100 mg total) by mouth 2 (two) times daily for 7 days. 10/15/20 10/22/20 Yes Duffy Bruce, MD  HYDROcodone bit-homatropine (HYCODAN) 5-1.5 MG/5ML syrup Take 5 mLs by mouth every 6 (six) hours as needed for cough. 10/15/20  Yes Duffy Bruce, MD  predniSONE (DELTASONE) 20 MG tablet Take 2 tablets (40 mg total) by mouth  daily for 5 days. 10/15/20 10/20/20 Yes Duffy Bruce, MD  dicyclomine (BENTYL) 10 MG capsule Take 2 capsules (20 mg total) by mouth 3 (three) times daily before meals for 10 days. 03/18/19 03/28/19  Menshew, Dannielle Karvonen, PA-C  dicyclomine (BENTYL) 20 MG tablet Take 1 tablet (20 mg total) by mouth 3 (three) times daily before meals. 08/15/18   Lucilla Lame, MD  escitalopram (LEXAPRO) 20 MG tablet Take 20 mg by mouth daily. 07/10/18   [provider]  metoCLOPramide (REGLAN) 5 MG tablet Take 1 tablet (5 mg total) by mouth every 8 (eight) hours as needed for up to 10 days for nausea or vomiting. 03/18/19 03/28/19  Menshew, Dannielle Karvonen, PA-C  naproxen (NAPROSYN) 500 MG tablet Take by mouth.    [provider]  omeprazole (PRILOSEC) 20 MG capsule Take 20 mg by mouth daily. 06/01/18   [provider]  ondansetron (ZOFRAN ODT) 4 MG disintegrating tablet Take 1 tablet (4 mg total) by mouth every 8 (eight) hours as needed. 05/03/20   Alfred Levins, [place], MD  tiZANidine (ZANAFLEX) 4 MG tablet TAKE 1 TABLET BY MOUTH THREE TIMES DAILY AS NEEDED FOR MUSCLE SPASM UP TO FOR 30 DOSES 06/20/18   [provider]  traMADol (ULTRAM) 50 MG tablet TAKE 1 TABLET BY MOUTH EVERY 8 HOURS AS NEEDED FOR UP TO 5 DAYS 06/20/18   [provider]    Allergies Augmentin [amoxicillin-pot clavulanate], Sulfa antibiotics, Transact [elemental sulfur], Amoxicillin, Keflex [  cephalexin], and Penicillins  History reviewed. No pertinent family history.  Social History Social History   Tobacco Use   Smoking status: Never   Smokeless tobacco: Never  Substance Use Topics   Alcohol use: Not Currently   Drug use: Never    Review of Systems  Review of Systems  Constitutional:  Positive for fatigue. Negative for fever.  HENT:  Positive for congestion, rhinorrhea and sore throat.   Eyes:  Negative for visual disturbance.  Respiratory:  Positive for cough and wheezing. Negative for shortness  of breath.   Cardiovascular:  Negative for chest pain.  Gastrointestinal:  Negative for abdominal pain, diarrhea, nausea and vomiting.  Genitourinary:  Negative for flank pain.  Musculoskeletal:  Negative for back pain and neck pain.  Skin:  Negative for rash and wound.  Neurological:  Negative for weakness.  All other systems reviewed and are negative.   ____________________________________________  PHYSICAL EXAM:      VITAL SIGNS: ED Triage Vitals  Enc Vitals Group     BP 10/15/20 0109 (!) 170/108     Pulse Rate 10/15/20 0109 96     Resp 10/15/20 0109 19     Temp 10/15/20 0109 98.4 F (36.9 C)     Temp Source 10/15/20 0109 Oral     SpO2 10/15/20 0109 100 %     Weight 10/15/20 0108 (!) 332 lb (150.6 kg)     Height 10/15/20 0108 5\' 9"  (1.753 m)     Head Circumference --      Peak Flow --      Pain Score 10/15/20 0108 5     Pain Loc --      Pain Edu? --      Excl. in [place]? --      Physical Exam Vitals and nursing note reviewed.  Constitutional:      General: She is not in acute distress.    Appearance: She is well-developed.  HENT:     Head: Normocephalic and atraumatic.  Eyes:     Conjunctiva/sclera: Conjunctivae normal.  Cardiovascular:     Rate and Rhythm: Normal rate and regular rhythm.     Heart sounds: Normal heart sounds. No murmur heard.   No friction rub.  Pulmonary:     Effort: Pulmonary effort is normal. No respiratory distress.     Breath sounds: Examination of the right-lower field reveals wheezing. Examination of the left-lower field reveals wheezing. Wheezing present. No rales.  Abdominal:     General: There is no distension.     Palpations: Abdomen is soft.     Tenderness: There is no abdominal tenderness.  Musculoskeletal:     Cervical back: Neck supple.  Skin:    General: Skin is warm.     Capillary Refill: Capillary refill takes less than 2 seconds.  Neurological:     Mental Status: She is alert and oriented to person, place, and time.      Motor: No abnormal muscle tone.      ____________________________________________   LABS (all labs ordered are listed, but only abnormal results are displayed)  Labs Reviewed  BASIC METABOLIC PANEL - Abnormal; Notable for the following components:      Result Value   Sodium 130 (*)    Chloride 97 (*)    Glucose, Bld 120 (*)    Calcium 8.8 (*)    All other components within normal limits  CBC - Abnormal; Notable for the following components:   WBC 11.4 (*)  Hemoglobin 9.1 (*)    HCT 31.2 (*)    MCV 66.8 (*)    MCH 19.5 (*)    MCHC 29.2 (*)    RDW 17.8 (*)    Platelets 452 (*)    All other components within normal limits  RESP PANEL BY RT-PCR (FLU A&B, COVID) ARPGX2  POC URINE PREG, ED  TROPONIN I (HIGH SENSITIVITY)  TROPONIN I (HIGH SENSITIVITY)    ____________________________________________  EKG: Normal sinus rhythm, ventricular rate 94.  PR 120, QRS 86, QTc 435.  No acute ST elevations or depressions.  No acute events of acute ischemia report. ________________________________________  RADIOLOGY All imaging, including plain films, CT scans, and ultrasounds, independently reviewed by me, and interpretations confirmed via formal radiology reads.  ED MD interpretation:   Chest x-ray: Clear  Official radiology report(s): DG Chest 2 View  Result Date: 10/15/2020 CLINICAL DATA:  Chest pain, productive cough EXAM: CHEST - 2 VIEW COMPARISON:  None. FINDINGS: Focal opacity at the left lung base represents a confluence of vascular and osseous shadows. The lungs are clear. No pneumothorax or pleural effusion. Cardiac size within normal limits. Pulmonary vascularity is normal. No acute bone abnormality. IMPRESSION: No active cardiopulmonary disease. Electronically Signed   By: Fidela Salisbury M.D.   On: 10/15/2020 01:30    ____________________________________________  PROCEDURES   Procedure(s) performed (including Critical  Care):  Procedures  ____________________________________________  INITIAL IMPRESSION / MDM / [place] / ED COURSE  As part of my medical decision making, I reviewed the following data within the [place] notes reviewed and incorporated, Old chart reviewed, Notes from prior ED visits, and [place] Controlled Substance Database       *Sonya Fisher was evaluated in Emergency Department on 10/15/2020 for the symptoms described in the history of present illness. She was evaluated in the context of the global COVID-19 pandemic, which necessitated consideration that the patient might be at risk for infection with the SARS-CoV-2 virus that causes COVID-19. Institutional protocols and algorithms that pertain to the evaluation of patients at risk for COVID-19 are in a state of rapid change based on information released by regulatory bodies including the CDC and federal and state organizations. These policies and algorithms were followed during the patient's care in the ED.  Some ED evaluations and interventions may be delayed as a result of limited staffing during the pandemic.*     Medical Decision Making: 35 year old female here with cough, congestion, chest pain after coughing.  Suspect URI with possible early atypical pneumonia.  Chest x-ray is clear.  Patient is satting 99% on room air.  EKG is nonischemic with negative troponin despite symptoms greater than 48 hours constantly, do not suspect ACS.  She has no lower extremity swelling, pleurisy, or symptoms to suggest PE and her symptoms are highly consistent with infectious URI and she presents with her husband with similar symptoms.  CBC with mild, likely reactive leukocytosis.  BMP largely unremarkable.  COVID-negative.  Patient given prednisone, nebulizer with improvement.  Will discharge with empiric antibiotics and steroids with inhaler for possible reactive airway component.  Return precautions  given.  ____________________________________________  FINAL CLINICAL IMPRESSION(S) / ED DIAGNOSES  Final diagnoses:  Upper respiratory tract infection, unspecified type     MEDICATIONS GIVEN DURING THIS VISIT:  Medications  ipratropium-albuterol (DUONEB) 0.5-2.5 (3) MG/3ML nebulizer solution 3 mL (3 mLs Nebulization Given 10/15/20 0311)  predniSONE (DELTASONE) tablet 60 mg (60 mg Oral Given 10/15/20 0311)  HYDROcodone-acetaminophen (NORCO/VICODIN)  5-325 MG per tablet 1 tablet (1 tablet Oral Given 10/15/20 0311)     ED Discharge Orders          Ordered    predniSONE (DELTASONE) 20 MG tablet  Daily        10/15/20 0315    albuterol (VENTOLIN HFA) 108 (90 Base) MCG/ACT inhaler  Every 6 hours PRN        10/15/20 0315    HYDROcodone bit-homatropine (HYCODAN) 5-1.5 MG/5ML syrup  Every 6 hours PRN        10/15/20 0315    doxycycline (VIBRAMYCIN) 100 MG capsule  2 times daily        10/15/20 0315             Note:  This document was prepared using Dragon voice recognition software and may include unintentional dictation errors.   Duffy Bruce, MD 10/15/20 [phone]

## 2020-12-23 ENCOUNTER — Other Ambulatory Visit: Payer: Self-pay

## 2020-12-23 ENCOUNTER — Encounter: Payer: Self-pay | Admitting: Emergency Medicine

## 2020-12-23 ENCOUNTER — Emergency Department: Payer: Self-pay

## 2020-12-23 DIAGNOSIS — U071 COVID-19: Secondary | ICD-10-CM | POA: Insufficient documentation

## 2020-12-23 LAB — CBC WITH DIFFERENTIAL/PLATELET
Abs Immature Granulocytes: 0.01 10*3/uL (ref 0.00–0.07)
Basophils Absolute: 0 10*3/uL (ref 0.0–0.1)
Basophils Relative: 1 %
Eosinophils Absolute: 0.2 10*3/uL (ref 0.0–0.5)
Eosinophils Relative: 3 %
HCT: 35.5 % — ABNORMAL LOW (ref 36.0–46.0)
Hemoglobin: 10.3 g/dL — ABNORMAL LOW (ref 12.0–15.0)
Immature Granulocytes: 0 %
Lymphocytes Relative: 32 %
Lymphs Abs: 2.2 10*3/uL (ref 0.7–4.0)
MCH: 20.5 pg — ABNORMAL LOW (ref 26.0–34.0)
MCHC: 29 g/dL — ABNORMAL LOW (ref 30.0–36.0)
MCV: 70.6 fL — ABNORMAL LOW (ref 80.0–100.0)
Monocytes Absolute: 0.5 10*3/uL (ref 0.1–1.0)
Monocytes Relative: 7 %
Neutro Abs: 3.9 10*3/uL (ref 1.7–7.7)
Neutrophils Relative %: 57 %
Platelets: 418 10*3/uL — ABNORMAL HIGH (ref 150–400)
RBC: 5.03 MIL/uL (ref 3.87–5.11)
RDW: 20 % — ABNORMAL HIGH (ref 11.5–15.5)
WBC: 6.8 10*3/uL (ref 4.0–10.5)
nRBC: 0 % (ref 0.0–0.2)

## 2020-12-23 NOTE — ED Triage Notes (Signed)
Patient ambulatory to triage with steady gait, without difficulty or distress noted; pt reports since last wk having prod cough clear sputum, congestion, mid CP radiating into back and neck

## 2020-12-24 ENCOUNTER — Emergency Department
Admission: EM | Admit: 2020-12-24 | Discharge: 2020-12-24 | Disposition: A | Discharge status: Home / Self Care | Payer: Self-pay | Attending: Emergency Medicine | Admitting: Emergency Medicine

## 2020-12-24 DIAGNOSIS — R059 Cough, unspecified: Secondary | ICD-10-CM

## 2020-12-24 DIAGNOSIS — U071 COVID-19: Secondary | ICD-10-CM

## 2020-12-24 LAB — COMPREHENSIVE METABOLIC PANEL
ALT: 13 U/L (ref 0–44)
AST: 15 U/L (ref 15–41)
Albumin: 3.7 g/dL (ref 3.5–5.0)
Alkaline Phosphatase: 78 U/L (ref 38–126)
Anion gap: 6 (ref 5–15)
BUN: 10 mg/dL (ref 6–20)
CO2: 23 mmol/L (ref 22–32)
Calcium: 8.7 mg/dL — ABNORMAL LOW (ref 8.9–10.3)
Chloride: 107 mmol/L (ref 98–111)
Creatinine, Ser: 0.65 mg/dL (ref 0.44–1.00)
GFR, Estimated: >60 mL/min (ref >60)
Glucose, Bld: 124 mg/dL — ABNORMAL HIGH (ref 70–99)
Potassium: 3.5 mmol/L (ref 3.5–5.1)
Sodium: 136 mmol/L (ref 135–145)
Total Bilirubin: 0.5 mg/dL (ref 0.3–1.2)
Total Protein: 7.5 g/dL (ref 6.5–8.1)

## 2020-12-24 LAB — RESP PANEL BY RT-PCR (FLU A&B, COVID) ARPGX2
Influenza A by PCR: NEGATIVE
Influenza B by PCR: NEGATIVE
SARS Coronavirus 2 by RT PCR: POSITIVE — AB

## 2020-12-24 LAB — TROPONIN I (HIGH SENSITIVITY)
Troponin I (High Sensitivity): <2 ng/L (ref <18)
Troponin I (High Sensitivity): 2 ng/L (ref <18)

## 2020-12-24 MED ORDER — KETOROLAC TROMETHAMINE 30 MG/ML IJ SOLN
30.0000 mg | Freq: Once | INTRAMUSCULAR | Status: AC
  Administered 2020-12-24: 08:00:00 30 mg via INTRAMUSCULAR
  Filled 2020-12-24: qty 1

## 2020-12-24 MED ORDER — BENZONATATE 100 MG PO CAPS: 100.0000 mg | ORAL_CAPSULE | Freq: Three times a day (TID) | ORAL | 0 refills | Status: DC | PRN

## 2020-12-24 MED ORDER — GUAIFENESIN-CODEINE 100-10 MG/5ML PO SYRP: 5.0000 mL | ORAL_SOLUTION | Freq: Three times a day (TID) | ORAL | 0 refills | Status: AC | PRN

## 2020-12-24 MED ORDER — ONDANSETRON 4 MG PO TBDP: 4.0000 mg | ORAL_TABLET | Freq: Three times a day (TID) | ORAL | 0 refills | Status: AC | PRN

## 2020-12-24 NOTE — ED Provider Notes (Signed)
[facility] Emergency Department Provider Note  ____________________________________________   Event Date/Time   First MD Initiated Contact with Patient 12/24/20 [phone]     (approximate)  I have reviewed the triage vital signs    HISTORY  Chief Complaint Chest Pain    HPI Sonya Fisher is a 35 y.o. female who presents with viral symptoms. Pt reports multiple symptoms including multiple symptoms including cough, congestion, chest pain over the past 7 days.  She states that it started like she had felt like she had some allergies.  Her symptoms have been constant, not better with Tylenol, TheraFlu.  Patient states that she is here mostly because of the cough.  Some occasional mild dizzy episodes.     Past Medical History:  Diagnosis Date   Anxiety    Panic attacks     There are no problems to display for this patient.   History reviewed. No pertinent surgical history.  Prior to Admission medications   Medication Sig Start Date End Date Taking? Authorizing Provider  albuterol (VENTOLIN HFA) 108 (90 Base) MCG/ACT inhaler Inhale 2 puffs into the lungs every 6 (six) hours as needed for wheezing or shortness of breath. 10/15/20   Duffy Bruce, MD  dicyclomine (BENTYL) 10 MG capsule Take 2 capsules (20 mg total) by mouth 3 (three) times daily before meals for 10 days. 03/18/19 03/28/19  Menshew, Dannielle Karvonen, PA-C  dicyclomine (BENTYL) 20 MG tablet Take 1 tablet (20 mg total) by mouth 3 (three) times daily before meals. 08/15/18   Lucilla Lame, MD  escitalopram (LEXAPRO) 20 MG tablet Take 20 mg by mouth daily. 07/10/18   [provider]  HYDROcodone bit-homatropine (HYCODAN) 5-1.5 MG/5ML syrup Take 5 mLs by mouth every 6 (six) hours as needed for cough. 10/15/20   Duffy Bruce, MD  metoCLOPramide (REGLAN) 5 MG tablet Take 1 tablet (5 mg total) by mouth every 8 (eight) hours as needed for up to 10 days for nausea or vomiting. 03/18/19 03/28/19  Menshew,  Dannielle Karvonen, PA-C  naproxen (NAPROSYN) 500 MG tablet Take by mouth.    [provider]  omeprazole (PRILOSEC) 20 MG capsule Take 20 mg by mouth daily. 06/01/18   [provider]  ondansetron (ZOFRAN ODT) 4 MG disintegrating tablet Take 1 tablet (4 mg total) by mouth every 8 (eight) hours as needed. 05/03/20   Alfred Levins, [place], MD  tiZANidine (ZANAFLEX) 4 MG tablet TAKE 1 TABLET BY MOUTH THREE TIMES DAILY AS NEEDED FOR MUSCLE SPASM UP TO FOR 30 DOSES 06/20/18   [provider]  traMADol (ULTRAM) 50 MG tablet TAKE 1 TABLET BY MOUTH EVERY 8 HOURS AS NEEDED FOR UP TO 5 DAYS 06/20/18   [provider]    Allergies Augmentin [amoxicillin-pot clavulanate], Sulfa antibiotics, Transact [elemental sulfur], Amoxicillin, Keflex [cephalexin], and Penicillins  No family history on file.  Social History Social History   Tobacco Use   Smoking status: Never   Smokeless tobacco: Never  Vaping Use   Vaping Use: Never used  Substance Use Topics   Alcohol use: Not Currently   Drug use: Never      Review of Systems Constitutional: No fever/chills Eyes: No visual changes. ENT: No sore throat.  Cough, congestion Cardiovascular: Chest pain Respiratory: Denies severe shortness of breath, cough Gastrointestinal: No abdominal pain.  + nausea, no vomiting.  No diarrhea.  No constipation. Genitourinary: Negative for dysuria. Musculoskeletal: Negative for back pain. Skin: Negative for rash. Neurological: Negative for headaches, focal  weakness or numbness. All other ROS negative ____________________________________________   PHYSICAL EXAM:  VITAL SIGNS: ED Triage Vitals  Enc Vitals Group     BP 12/23/20 2328 (!) 142/96     Pulse Rate 12/23/20 2328 86     Resp 12/23/20 2328 17     Temp 12/23/20 2328 98.8 F (37.1 C)     Temp Source 12/23/20 2328 Oral     SpO2 12/23/20 2328 96 %     Weight 12/23/20 2315 (!) 332 lb (150.6 kg)     Height 12/23/20 2315 5\' 9"   (1.753 m)     Head Circumference --      Peak Flow --      Pain Score 12/23/20 2314 9     Pain Loc --      Pain Edu? --      Excl. in [place]? --     Constitutional: Alert and oriented. Well appearing and in no acute distress. Eyes: Conjunctivae are normal. EOMI. Head: Atraumatic. Nose: Congestion Mouth/Throat: Mucous membranes are moist.   Neck: No stridor. Trachea Midline. FROM Cardiovascular: Normal rate, regular rhythm. Good peripheral circulation. Respiratory: no audible stridor, no increased work of breathing  Gastrointestinal: Soft and nontender. No distention.  Musculoskeletal: No lower extremity tenderness nor edema.  No joint effusions. Neurologic:  Normal speech and language. No gross focal neurologic deficits are appreciated.  Skin:  Skin is warm, dry and intact. No rash noted. Psychiatric: Mood and affect are normal. Speech and behavior are normal. GU: Deferred   ____________________________________________   LABS (all labs ordered are listed, but only abnormal results are displayed)  Labs Reviewed  RESP PANEL BY RT-PCR (FLU A&B, COVID) ARPGX2 - Abnormal; Notable for the following components:      Result Value   SARS Coronavirus 2 by RT PCR POSITIVE (*)    All other components within normal limits  CBC WITH DIFFERENTIAL/PLATELET - Abnormal; Notable for the following components:   Hemoglobin 10.3 (*)    HCT 35.5 (*)    MCV 70.6 (*)    MCH 20.5 (*)    MCHC 29.0 (*)    RDW 20.0 (*)    Platelets 418 (*)    All other components within normal limits  COMPREHENSIVE METABOLIC PANEL - Abnormal; Notable for the following components:   Glucose, Bld 124 (*)    Calcium 8.7 (*)    All other components within normal limits  POC URINE PREG, ED  TROPONIN I (HIGH SENSITIVITY)  TROPONIN I (HIGH SENSITIVITY)   ____________________________________________   RADIOLOGY Robert Bellow, personally viewed and evaluated these images (plain radiographs) as part of my medical  decision making, as well as reviewing the written report by the radiologist.  ED MD interpretation: No pneumonia  Official radiology report(s): DG Chest 2 View  Result Date: 12/23/2020 CLINICAL DATA:  Central chest pain for 2 days, initial encounter EXAM: CHEST - 2 VIEW COMPARISON:  10/15/2020 FINDINGS: The heart size and mediastinal contours are within normal limits. Both lungs are clear. The visualized skeletal structures are unremarkable. IMPRESSION: No active cardiopulmonary disease. Electronically Signed   By: Inez Catalina M.D.   On: 12/23/2020 23:45    ____________________________________________   PROCEDURES  Procedure(s) performed (including Critical Care):  Procedures   ____________________________________________   INITIAL IMPRESSION / ASSESSMENT AND PLAN / ED COURSE  Ommie Degeorge was evaluated in Emergency Department on 12/24/2020 for the symptoms described in the history of present illness. She was evaluated in the context of  the global COVID-19 pandemic, which necessitated consideration that the patient might be at risk for infection with the SARS-CoV-2 virus that causes COVID-19. Institutional protocols and algorithms that pertain to the evaluation of patients at risk for COVID-19 are in a state of rapid change based on information released by regulatory bodies including the CDC and federal and state organizations. These policies and algorithms were followed during the patient's care in the ED.    EKG my interpretation is normal sinus rate of 86 any ST elevation or T wave inversions, normal intervals  Pt presents with multiple symptoms.  Given the prevalence of  COVID 19 I suspect this is mostly likely secondary to viral illness such as COVID 19.  Pt very well appearing. Well hydrated on exam, low suspicion for electrolyte abnormalities or AKI. Pt not hypoxic and breathing well therefore does not require admission to hospital.  Low suspicion of PE/ACS given no significant SOB  and so well appearing.  Will proceed with COVID testing   Covid +   Labs re-assuring   No signs ACS   Preg test was negative! Nurse had trouble uploading but confirmed it was negative.    Pt comfortable dc home with symptom management.  Knows not to drive on codeine cough.      ____________________________________________   FINAL CLINICAL IMPRESSION(S) / ED DIAGNOSES   Final diagnoses:  COVID-19  Cough, unspecified type      MEDICATIONS GIVEN DURING THIS VISIT:  Medications  ketorolac (TORADOL) 30 MG/ML injection 30 mg (30 mg Intramuscular Given 12/24/20 0737)     ED Discharge Orders     None        Note:  This document was prepared using Dragon voice recognition software and may include unintentional dictation errors.   Vanessa [place], MD 12/24/20 [phone]

## 2020-12-24 NOTE — ED Notes (Signed)
Pt reports upper resp sx's for 1.5 weeks and a couple of days ago she started hurting in her chest when she coughs. Pt reports cough is productive with light mucous. Denies pain that radiates or SOB

## 2020-12-24 NOTE — ED Notes (Signed)
Pt verbalizes understanding of d/c instructions and follow up. 

## 2020-12-24 NOTE — Discharge Instructions (Addendum)
COVID positive  Take tylenol 1g every 8 hours---nyquil and theraflu have tylenol In it--do not take more then 4g or 4000mg  in one day.  And can take  ibuprofen 600mg  every 6-8 hours with food as long as no history of kidney issues or on a blood thinner.  Return to ER for worsening SOB, unable to keep food down, or any other concerns.   Do not drive on codeine cough syrup.

## 2021-05-28 IMAGING — CT CT ABD-PELV W/ CM
2 of 4 series · 16 of 46 positions shown, 18 images · IV contrast (APPLIED)
Comparison: CT 03/18/2019

CLINICAL DATA: Lower abdominal pain for the past hour, diarrhea and
burning sensation with urination

EXAM:
CT ABDOMEN AND PELVIS WITH CONTRAST
TECHNIQUE: Multidetector CT imaging of the abdomen and pelvis was performed
using the standard protocol following bolus administration of
intravenous contrast.
CONTRAST:  125mL OMNIPAQUE IOHEXOL 300 MG/ML  SOLN

[Series 2: axial st · axial · 0.98mm/px · z∈[-1072,-578]mm · 13 of 109 slices shown, 15 images]
[im 5/109  soft-tissue]
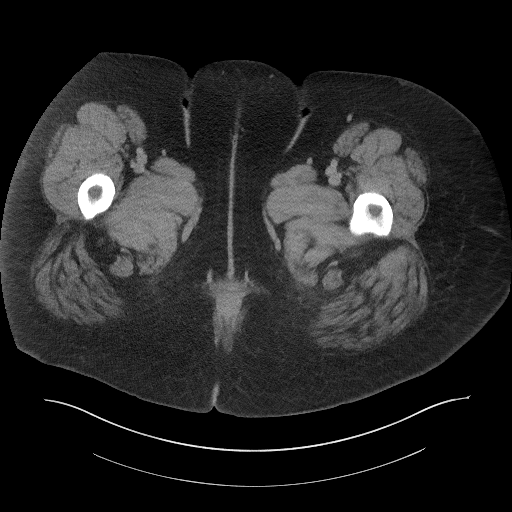
[im 5/109  bone]
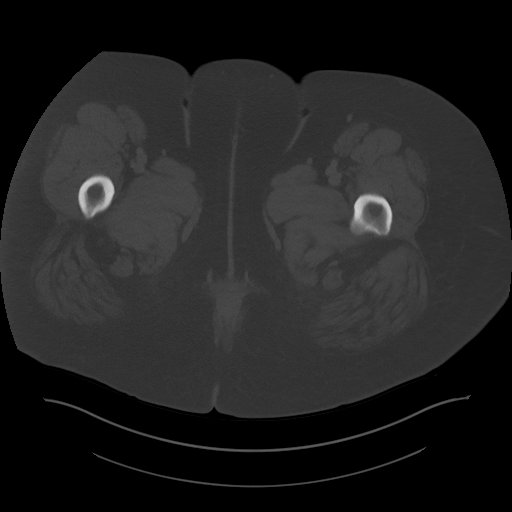
[im 14/109  soft-tissue]
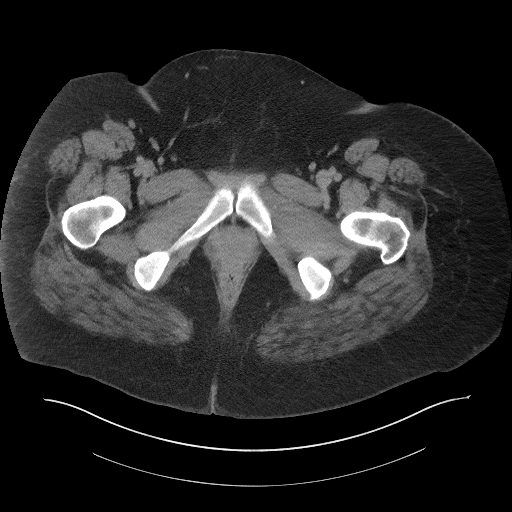
[im 23/109  soft-tissue]
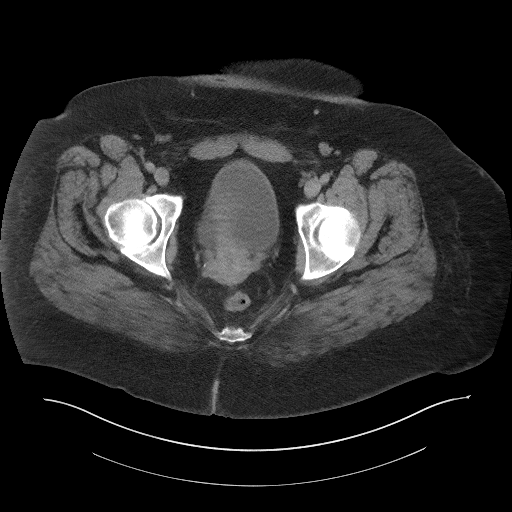
[im 32/109  soft-tissue]
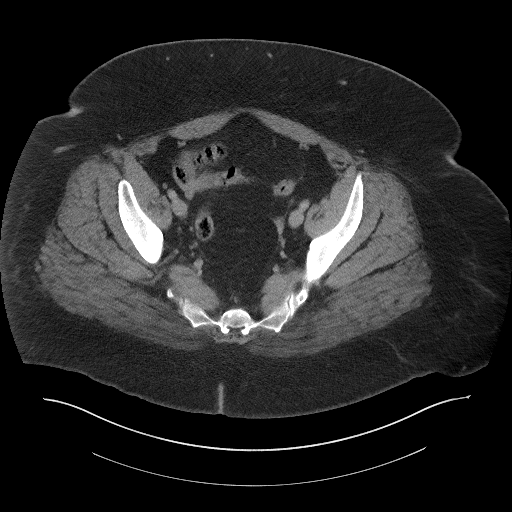
[im 37/109  soft-tissue]
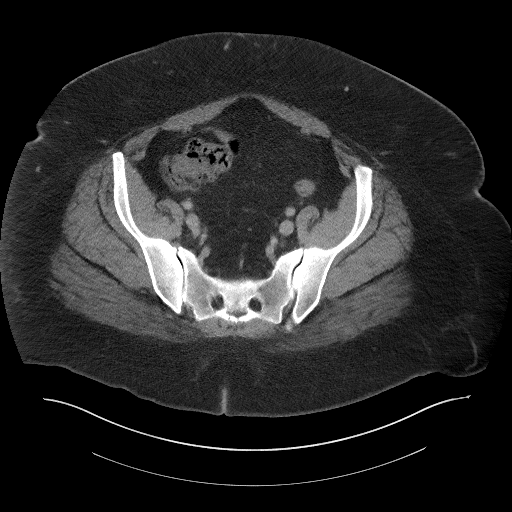
[im 46/109  soft-tissue]
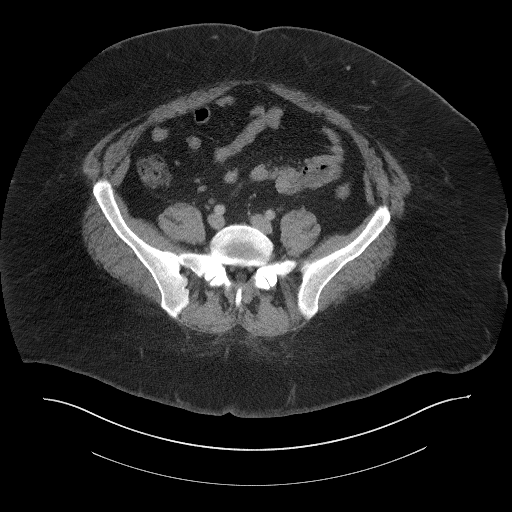
[im 55/109  soft-tissue]
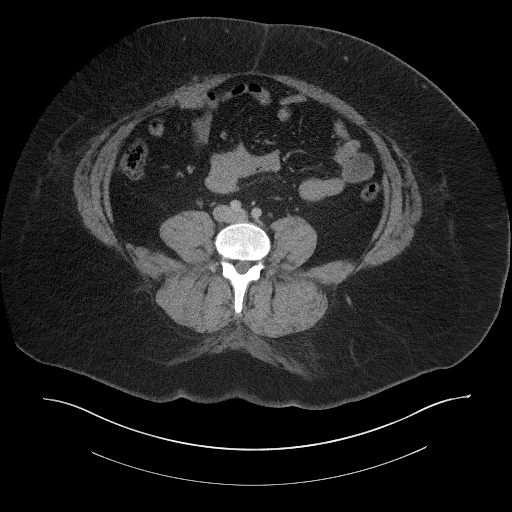
[im 64/109  soft-tissue]
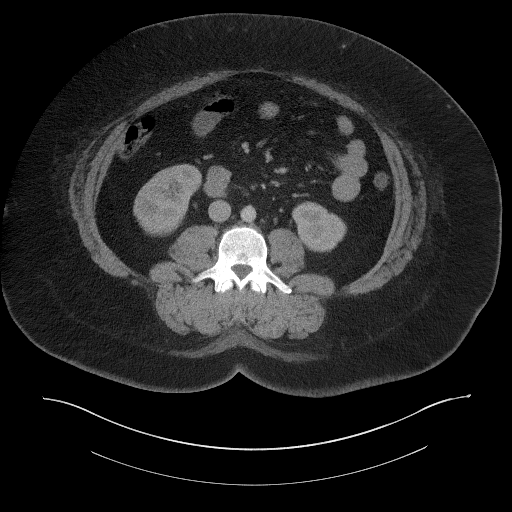
[im 73/109  soft-tissue]
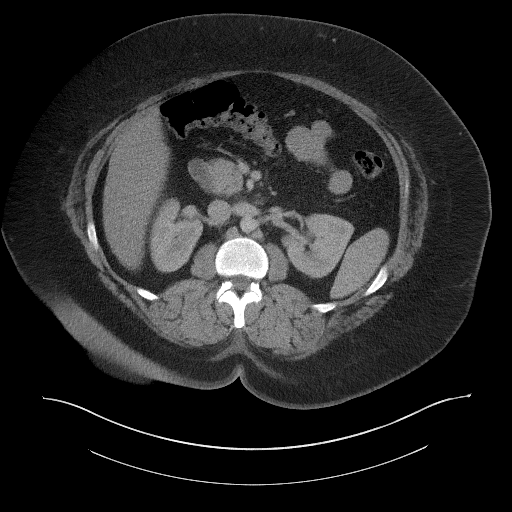
[im 73/109  bone]
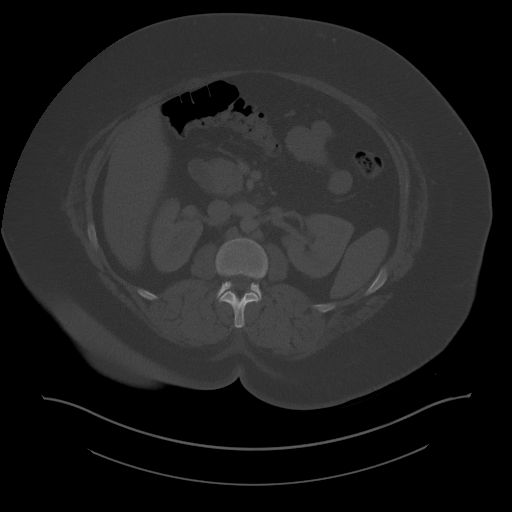
[im 77/109  soft-tissue]
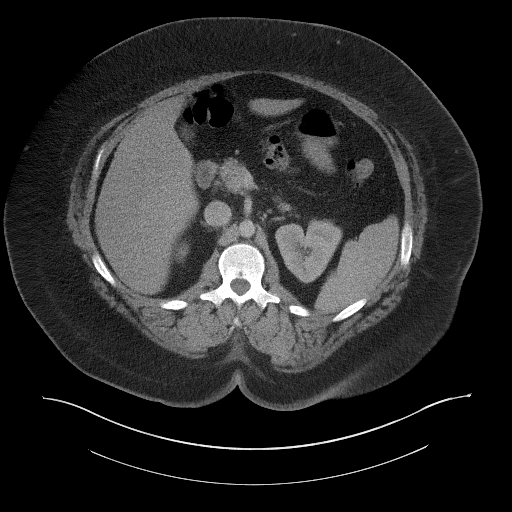
[im 86/109  soft-tissue]
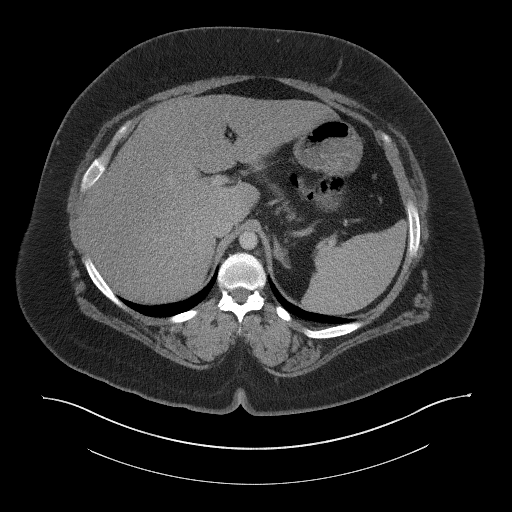
[im 95/109  soft-tissue]
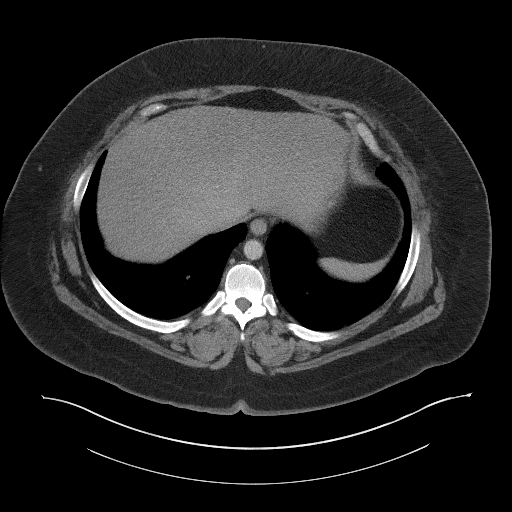
[im 104/109  soft-tissue]
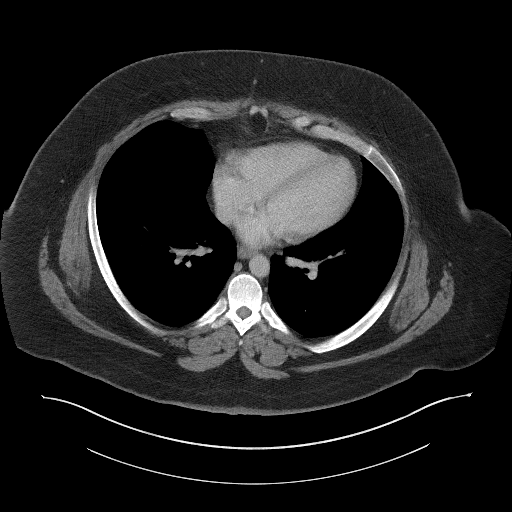

[Series 5: coronal st · coronal · 1.00mm/px · 3 of 133 slices shown]
[im 45/133  soft-tissue]
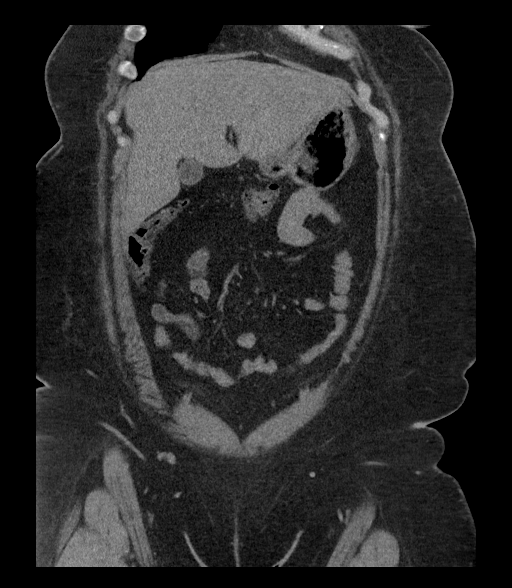
[im 59/133  soft-tissue]
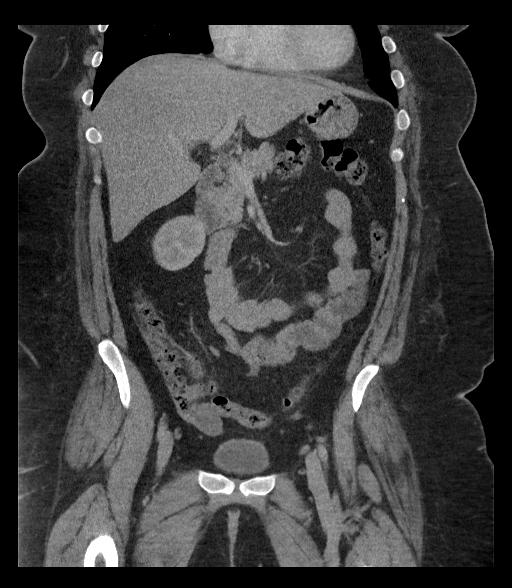
[im 74/133  soft-tissue]
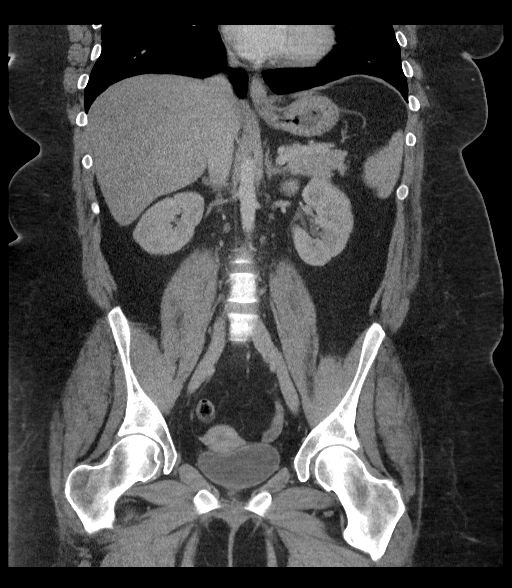

[16 of 46 positions shown; findings below may reference images not displayed]

FINDINGS: Lower chest: Lung bases are clear. Normal heart size. No pericardial
effusion.

Hepatobiliary: Diffuse hepatic hypoattenuation compatible with
hepatic steatosis. No worrisome focal liver lesions. Smooth liver
surface contour. Normal hepatic attenuation. Normal gallbladder and
biliary tree.

Pancreas: No pancreatic ductal dilatation or surrounding
inflammatory changes.

Spleen: Normal in size. No concerning splenic lesions.

Adrenals/Urinary Tract: Normal adrenals. Kidneys are normally
located with symmetric enhancement. No suspicious renal lesion,
urolithiasis or hydronephrosis. No gross bladder abnormality
accounting for degree of distension.

Stomach/Bowel: Distal esophagus and stomach are unremarkable.
Congenital small malrotation of the small bowel with the duodenum
failing to cross midline. No small bowel thickening or dilatation.
Appendix is not visualized. No focal inflammation the vicinity of
the cecum to suggest an occult appendicitis. No colonic dilatation
or wall thickening.

Vascular/Lymphatic: No significant vascular findings are present. No
enlarged abdominal or pelvic lymph nodes.

Reproductive: Anteverted uterus. No concerning adnexal masses or
lesions.

Other: No abdominopelvic free fluid or free gas. No bowel containing
hernias. Mild posterior body wall edema.

Musculoskeletal: No acute osseous abnormality or suspicious osseous
lesion.
IMPRESSION: 1. No acute abdominopelvic abnormality to provide cause for
patient's symptoms. Specifically no evidence of urolithiasis,
hydronephrosis or other acute urinary tract abnormality.
2. Congenital malrotation of the small bowel without evidence of
obstruction or acute complication.
3. Hepatic steatosis.
4. Nonvisualization of the appendix without CT features to suggest
an occult appendicitis.

## 2021-08-23 ENCOUNTER — Emergency Department: Payer: Self-pay

## 2021-08-23 ENCOUNTER — Emergency Department
Admission: EM | Admit: 2021-08-23 | Discharge: 2021-08-23 | Disposition: A | Discharge status: Home / Self Care | Payer: Self-pay | Attending: Emergency Medicine | Admitting: Emergency Medicine

## 2021-08-23 ENCOUNTER — Other Ambulatory Visit: Payer: Self-pay

## 2021-08-23 DIAGNOSIS — R0789 Other chest pain: Secondary | ICD-10-CM | POA: Insufficient documentation

## 2021-08-23 DIAGNOSIS — D649 Anemia, unspecified: Secondary | ICD-10-CM | POA: Insufficient documentation

## 2021-08-23 DIAGNOSIS — R42 Dizziness and giddiness: Secondary | ICD-10-CM | POA: Insufficient documentation

## 2021-08-23 DIAGNOSIS — N939 Abnormal uterine and vaginal bleeding, unspecified: Secondary | ICD-10-CM | POA: Insufficient documentation

## 2021-08-23 DIAGNOSIS — R079 Chest pain, unspecified: Secondary | ICD-10-CM

## 2021-08-23 LAB — POC URINE PREG, ED: Preg Test, Ur: NEGATIVE

## 2021-08-23 LAB — CBC WITH DIFFERENTIAL/PLATELET
Abs Immature Granulocytes: 0.03 10*3/uL (ref 0.00–0.07)
Basophils Absolute: 0.1 10*3/uL (ref 0.0–0.1)
Basophils Relative: 1 %
Eosinophils Absolute: 0 10*3/uL (ref 0.0–0.5)
Eosinophils Relative: 0 %
HCT: 35.1 % — ABNORMAL LOW (ref 36.0–46.0)
Hemoglobin: 9.8 g/dL — ABNORMAL LOW (ref 12.0–15.0)
Immature Granulocytes: 0 %
Lymphocytes Relative: 11 %
Lymphs Abs: 1.1 10*3/uL (ref 0.7–4.0)
MCH: 19.1 pg — ABNORMAL LOW (ref 26.0–34.0)
MCHC: 27.9 g/dL — ABNORMAL LOW (ref 30.0–36.0)
MCV: 68.4 fL — ABNORMAL LOW (ref 80.0–100.0)
Monocytes Absolute: 0.5 10*3/uL (ref 0.1–1.0)
Monocytes Relative: 5 %
Neutro Abs: 8.3 10*3/uL — ABNORMAL HIGH (ref 1.7–7.7)
Neutrophils Relative %: 83 %
Platelets: 437 10*3/uL — ABNORMAL HIGH (ref 150–400)
RBC: 5.13 MIL/uL — ABNORMAL HIGH (ref 3.87–5.11)
RDW: 18.8 % — ABNORMAL HIGH (ref 11.5–15.5)
Smear Review: NORMAL
WBC: 9.8 10*3/uL (ref 4.0–10.5)
nRBC: 0 % (ref 0.0–0.2)

## 2021-08-23 LAB — URINALYSIS, ROUTINE W REFLEX MICROSCOPIC
Bilirubin Urine: NEGATIVE
Glucose, UA: NEGATIVE mg/dL
Ketones, ur: NEGATIVE mg/dL
Nitrite: NEGATIVE
Protein, ur: 100 mg/dL — AB
RBC / HPF: >50 RBC/hpf — ABNORMAL HIGH (ref 0–5)
Specific Gravity, Urine: 1.024 (ref 1.005–1.030)
Squamous Epithelial / HPF: NONE SEEN (ref 0–5)
pH: 6 (ref 5.0–8.0)

## 2021-08-23 LAB — COMPREHENSIVE METABOLIC PANEL
ALT: 17 U/L (ref 0–44)
AST: 17 U/L (ref 15–41)
Albumin: 4.3 g/dL (ref 3.5–5.0)
Alkaline Phosphatase: 89 U/L (ref 38–126)
Anion gap: 7 (ref 5–15)
BUN: 14 mg/dL (ref 6–20)
CO2: 20 mmol/L — ABNORMAL LOW (ref 22–32)
Calcium: 9.5 mg/dL (ref 8.9–10.3)
Chloride: 111 mmol/L (ref 98–111)
Creatinine, Ser: 0.94 mg/dL (ref 0.44–1.00)
GFR, Estimated: >60 mL/min (ref >60)
Glucose, Bld: 117 mg/dL — ABNORMAL HIGH (ref 70–99)
Potassium: 4.3 mmol/L (ref 3.5–5.1)
Sodium: 138 mmol/L (ref 135–145)
Total Bilirubin: 0.8 mg/dL (ref 0.3–1.2)
Total Protein: 8.3 g/dL — ABNORMAL HIGH (ref 6.5–8.1)

## 2021-08-23 LAB — TROPONIN I (HIGH SENSITIVITY): Troponin I (High Sensitivity): 3 ng/L (ref <18)

## 2021-08-23 MED ORDER — IOHEXOL 350 MG/ML SOLN
100.0000 mL | Freq: Once | INTRAVENOUS | Status: AC | PRN
  Administered 2021-08-23: 100 mL via INTRAVENOUS

## 2021-08-23 NOTE — ED Provider Triage Note (Signed)
Emergency Medicine Provider Triage Evaluation Note  Sonya Fisher , a 36 y.o. female  was evaluated in triage.  Pt complains of L side chest pain radiating to L arm. Symptoms x 3-4 days but worse today. No cardiac HX. No SOB. Reproducible as sharp pain with palpation of L chest wall. Pt also reports anemia secondary to heavy menstrual bleeding.  Review of Systems  Positive: L side chest pain Negative: SOB, cough, URI symptoms, GI complaints  Physical Exam  There were no vitals taken for this visit. Gen:   Awake, no distress   Resp:  Normal effort  MSK:   Moves extremities without difficulty  Other:    Medical Decision Making  Medically screening exam initiated at 3:49 PM.  Appropriate orders placed.  Sonya Fisher was informed that the remainder of the evaluation will be completed by another provider, this initial triage assessment does not replace that evaluation, and the importance of remaining in the ED until their evaluation is complete.  L chest pain x 3-4 days. No sob. No cough. Labs, EKG, chest xray ordered   Darletta Moll, PA-C 08/23/21 1552

## 2021-08-23 NOTE — Discharge Instructions (Addendum)
-  Please follow-up with your primary care provider or the OB/GYN specialist listed in these instructions in regards to your vaginal bleeding.  -The cardiologist will be contacting you within the first couple days.  If they do not, you may call them using the contact information listed in these instructions to schedule appointment.  -Return to the emergency department anytime if you begin to experience any new or worsening symptoms.

## 2021-08-23 NOTE — ED Notes (Signed)
See triage note  Presents with some chest pain and weakness  Sx's started couple of days ago  Also having some abd cramping  Afebrile on arrival

## 2021-08-23 NOTE — ED Provider Notes (Signed)
[facility] Provider Note    Event Date/Time   First MD Initiated Contact with Patient 08/23/21 1740     (approximate)   History   Chief Complaint Chest Pain (/)   HPI Sonya Fisher is a 36 y.o. female, history of anxiety, presents emergency department for evaluation of multiple symptoms, including chest pain, shortness of breath, dizziness, weakness, and abdominal cramping.   She states that she has been having abdominal cramping in the pelvic region bilaterally, associated with increased vaginal bleeding from her menstrual cycles.  She states that she frequently has clots the size of 1 to 3 cm.  She was previously seen for this on 08/11/2021, where she was treated with medroxyprogesterone and iron supplementation.  She states the cramps and vaginal bleeding have persisted.  In regards to her chest pain, she states that it started last night and has been persistent.  Associated with increased shortness of breath as well.  Endorses some pain on the left side of her chest, that is somewhat reproducible with palpation.   Denies fever/chills, flank pain, dysuria, headache, bowel/bladder dysfunction, hemoptysis, leg swelling, back pain, or flank pain.  History Limitations: No limitations.        Physical Exam  Triage Vital Signs: ED Triage Vitals  Enc Vitals Group     BP 08/23/21 1550 (!) 157/102     Pulse Rate 08/23/21 1550 96     Resp 08/23/21 1550 18     Temp 08/23/21 1550 98 F (36.7 C)     Temp Source 08/23/21 1550 Oral     SpO2 08/23/21 1550 100 %     Weight 08/23/21 1551 (!) 330 lb (149.7 kg)     Height 08/23/21 1551 '5\' 9"'$  (1.753 m)     Head Circumference --      Peak Flow --      Pain Score 08/23/21 1550 9     Pain Loc --      Pain Edu? --      Excl. in [place]? --     Most recent vital signs: Vitals:   08/23/21 1550 08/23/21 1759  BP: (!) 157/102 (!) 150/100  Pulse: 96 90  Resp: 18 18  Temp: 98 F (36.7 C)   SpO2: 100% 100%     General: Awake, appears anxious. Skin: Warm, dry. No rashes or lesions.  Eyes: PERRL. Conjunctivae normal.  CV: Good peripheral perfusion.  S1-S2 present.  No murmurs, rubs, or gallops. Resp: Normal effort.  Lung sounds clear bilaterally. Abd: Soft, non-tender. No distention.  No obvious masses. Neuro: At baseline. No gross neurological deficits.   Focused Exam: Mild chest wall tenderness on the left side.  No rashes, lesions, or ecchymosis.  Physical Exam    ED Results / Procedures / Treatments  Labs (all labs ordered are listed, but only abnormal results are displayed) Labs Reviewed  COMPREHENSIVE METABOLIC PANEL - Abnormal; Notable for the following components:      Result Value   CO2 20 (*)    Glucose, Bld 117 (*)    Total Protein 8.3 (*)    All other components within normal limits  CBC WITH DIFFERENTIAL/PLATELET - Abnormal; Notable for the following components:   RBC 5.13 (*)    Hemoglobin 9.8 (*)    HCT 35.1 (*)    MCV 68.4 (*)    MCH 19.1 (*)    MCHC 27.9 (*)    RDW 18.8 (*)    Platelets 437 (*)  Neutro Abs 8.3 (*)    All other components within normal limits  URINALYSIS, ROUTINE W REFLEX MICROSCOPIC - Abnormal; Notable for the following components:   Color, Urine AMBER (*)    APPearance CLOUDY (*)    Hgb urine dipstick LARGE (*)    Protein, ur 100 (*)    Leukocytes,Ua TRACE (*)    RBC / HPF >50 (*)    Bacteria, UA FEW (*)    All other components within normal limits  POC URINE PREG, ED  TROPONIN I (HIGH SENSITIVITY)  TROPONIN I (HIGH SENSITIVITY)     EKG Sinus rhythm, rate of 92, no T-segment changes, no AV blocks, no axis deviations, normal QRS interval, no QT prolongation.   RADIOLOGY  ED Provider Interpretation: I personally reviewed and interpreted these images.  Transvaginal ultrasound does not show any acute abnormalities.  CT angiogram does not show any evidence of pulmonary embolism.  US PELVIC COMPLETE W TRANSVAGINAL AND TORSION  R/O  Result Date: 08/23/2021 CLINICAL DATA:  Vaginal bleeding, pelvic pain EXAM: TRANSABDOMINAL AND TRANSVAGINAL ULTRASOUND OF PELVIS DOPPLER ULTRASOUND OF OVARIES TECHNIQUE: Both transabdominal and transvaginal ultrasound examinations of the pelvis were performed. Transabdominal technique was performed for global imaging of the pelvis including uterus, ovaries, adnexal regions, and pelvic cul-de-sac. It was necessary to proceed with endovaginal exam following the transabdominal exam to visualize the uterus, endometrium, ovaries and adnexa. Color and duplex Doppler ultrasound was utilized to evaluate blood flow to the ovaries. COMPARISON:  None Available. FINDINGS: Uterus Measurements: 8.8 x 3.8 x 4.3 cm = volume: 75 mL. No fibroids or other mass visualized. Endometrium Thickness: 10 mm in thickness.  No focal abnormality visualized. Right ovary Measurements: 2.6 x 2.5 x 1.8 cm = volume: 6 mL. Normal appearance/no adnexal mass. Left ovary Measurements: 2.4 x 2.0 x 3.2 cm = volume: 8 mL. 2 cm dominant follicle. No adnexal mass. Pulsed Doppler evaluation of both ovaries demonstrates normal low-resistance arterial and venous waveforms. Other findings No abnormal free fluid. IMPRESSION: Normal study.  No evidence of ovarian torsion or mass. Electronically Signed   By: Rolm Baptise M.D.   On: 08/23/2021 20:05   CT Angio Chest PE W and/or Wo Contrast  Result Date: 08/23/2021 CLINICAL DATA:  Pulmonary embolism (PE) suspected, unknown D-dimer. Chest pain EXAM: CT ANGIOGRAPHY CHEST WITH CONTRAST TECHNIQUE: Multidetector CT imaging of the chest was performed using the standard protocol during bolus administration of intravenous contrast. Multiplanar CT image reconstructions and MIPs were obtained to evaluate the vascular anatomy. RADIATION DOSE REDUCTION: This exam was performed according to the departmental dose-optimization program which includes automated exposure control, adjustment of the mA and/or kV according to  patient size and/or use of iterative reconstruction technique. CONTRAST:  156m OMNIPAQUE IOHEXOL 350 MG/ML SOLN COMPARISON:  None Available. FINDINGS: Cardiovascular: No filling defects in the pulmonary arteries to suggest pulmonary emboli. Heart is normal size. Aorta is normal caliber. Mediastinum/Nodes: No mediastinal, hilar, or axillary adenopathy. Trachea and esophagus are unremarkable. Thyroid unremarkable. Lungs/Pleura: Lungs are clear. No focal airspace opacities or suspicious nodules. No effusions. Upper Abdomen: Imaging into the upper abdomen demonstrates no acute findings. Musculoskeletal: Chest wall soft tissues are unremarkable. No acute bony abnormality. Review of the MIP images confirms the above findings. IMPRESSION: No evidence of pulmonary embolus. No acute cardiopulmonary disease. Electronically Signed   By: KRolm BaptiseM.D.   On: 08/23/2021 19:23   DG Chest 2 View  Result Date: 08/23/2021 CLINICAL DATA:  Chest pain left arm pain shortz  of breath and dizziness in a 36 year old female. EXAM: CHEST - 2 VIEW COMPARISON:  December 23, 2020. FINDINGS: The heart size and mediastinal contours are within normal limits. Both lungs are clear. The visualized skeletal structures are unremarkable. IMPRESSION: No active cardiopulmonary disease. Electronically Signed   By: Zetta Bills M.D.   On: 08/23/2021 16:30    PROCEDURES:  Critical Care performed: N/A.  Procedures    MEDICATIONS ORDERED IN ED: Medications  iohexol (OMNIPAQUE) 350 MG/ML injection 100 mL (100 mLs Intravenous Contrast Given 08/23/21 1906)     IMPRESSION / MDM / ASSESSMENT AND PLAN / ED COURSE  I reviewed the triage vital signs and the nursing notes.                              Differential diagnosis includes, but is not limited to, ACS, pulmonary embolism, abnormal uterine bleeding, uterine fibroids, anemia, pneumonia, costochondritis,   ED Course Patient appears well, mildly hypertensive at 150/100, otherwise  normal vitals.  CBC shows no leukocytosis.  Anemia present 9.8, increased from 9.2 from her last CBC 12 days prior.  CMP shows no significant electrolyte abnormalities, AKI, or transaminitis.  Initial troponin 3, consistent with previous values.  EKG unremarkable.  Unlikely ACS or myocarditis/pericarditis.  Urinalysis shows large amounts of hemoglobin, no evidence of infection.  Likely contamination from active vaginal bleeding.  Assessment/Plan Patient presents with chest pain/shortness of breath x1 day and lower abdominal pain with vaginal bleeding x4 days.  In regards to her chest pain/shortness of breath, she appears well clinically.   Lab work-up is reassuring.  She is PERC positive, due to contraceptive use, however CT angio shows no evidence of pulmonary embolism or other acute pathologies.  I suspect that her shortness of breath may be related to her anemia, though etiology is unclear.  Very low suspicion for any serious or life-threatening pathology though. Chest pain appears to be somewhat reproducible on the left side, suggestive of possible costochondritis.  We will provide her with a referral over to cardiology for further evaluation.  In regards to her lower abdominal pain, her exam is unimpressive.  Urinalysis does show large amounts of hemoglobin, though this is likely contamination from vaginal bleeding.  She is not having any flank pain at this time and does not have any reported history of ureterolithiasis.  No signs of infection.  Ultrasound does not show any acute abnormalities.  Unknown etiology of her lower abdominal cramping or persistent vaginal bleeding, though she is hemodynamically stable at this time.  Hemoglobin has increased since last time it was checked a few weeks prior.  We will refer her to OB/GYN for further evaluation and management.  Considered admission for this patient, but given her stable presentation and unremarkable work-up, she is unlikely to benefit  from admission.  Provided the patient with anticipatory guidance, return precautions, and educational material. Encouraged the patient to return to the emergency department at any time if they begin to experience any new or worsening symptoms. Patient expressed understanding and agreed with the plan.   Patient's presentation is most consistent with acute complicated illness / injury requiring diagnostic workup.       FINAL CLINICAL IMPRESSION(S) / ED DIAGNOSES   Final diagnoses:  Nonspecific chest pain  Abnormal uterine bleeding     Rx / DC Orders   ED Discharge Orders          Ordered    Ambulatory  referral to Cardiology       Comments: If you have not heard from the Cardiology office within the next 72 hours please call [phone].   08/23/21 2015             Note:  This document was prepared using Dragon voice recognition software and may include unintentional dictation errors.   Teodoro Spray, [place] 08/23/21 2016    Duffy Bruce, MD 08/23/21 2340

## 2021-08-23 NOTE — ED Triage Notes (Signed)
Pt to ED via POV from home. Pt reports CP, left arm pain, SOB, dizziness, weakness and abdominal cramping x3-4 days. Pt denies blood thinners. Pt reports hx anemia.

## 2021-09-05 ENCOUNTER — Emergency Department
Admission: EM | Admit: 2021-09-05 | Discharge: 2021-09-05 | Disposition: A | Discharge status: Home / Self Care | Payer: BC Managed Care – PPO | Attending: Emergency Medicine | Admitting: Emergency Medicine

## 2021-09-05 ENCOUNTER — Emergency Department: Payer: BC Managed Care – PPO

## 2021-09-05 ENCOUNTER — Other Ambulatory Visit: Payer: Self-pay

## 2021-09-05 DIAGNOSIS — R109 Unspecified abdominal pain: Secondary | ICD-10-CM | POA: Diagnosis present

## 2021-09-05 DIAGNOSIS — K580 Irritable bowel syndrome with diarrhea: Secondary | ICD-10-CM

## 2021-09-05 DIAGNOSIS — R197 Diarrhea, unspecified: Secondary | ICD-10-CM | POA: Diagnosis not present

## 2021-09-05 DIAGNOSIS — R110 Nausea: Secondary | ICD-10-CM | POA: Insufficient documentation

## 2021-09-05 LAB — COMPREHENSIVE METABOLIC PANEL
ALT: 16 U/L (ref 0–44)
AST: 16 U/L (ref 15–41)
Albumin: 3.9 g/dL (ref 3.5–5.0)
Alkaline Phosphatase: 78 U/L (ref 38–126)
Anion gap: 10 (ref 5–15)
BUN: 9 mg/dL (ref 6–20)
CO2: 22 mmol/L (ref 22–32)
Calcium: 8.7 mg/dL — ABNORMAL LOW (ref 8.9–10.3)
Chloride: 105 mmol/L (ref 98–111)
Creatinine, Ser: 0.72 mg/dL (ref 0.44–1.00)
GFR, Estimated: >60 mL/min (ref >60)
Glucose, Bld: 93 mg/dL (ref 70–99)
Potassium: 4 mmol/L (ref 3.5–5.1)
Sodium: 137 mmol/L (ref 135–145)
Total Bilirubin: 0.8 mg/dL (ref 0.3–1.2)
Total Protein: 7.8 g/dL (ref 6.5–8.1)

## 2021-09-05 LAB — URINALYSIS, ROUTINE W REFLEX MICROSCOPIC
Bacteria, UA: NONE SEEN
Bilirubin Urine: NEGATIVE
Glucose, UA: NEGATIVE mg/dL
Ketones, ur: 5 mg/dL — AB
Nitrite: NEGATIVE
Protein, ur: NEGATIVE mg/dL
Specific Gravity, Urine: 1.017 (ref 1.005–1.030)
pH: 5 (ref 5.0–8.0)

## 2021-09-05 LAB — CBC
HCT: 31.2 % — ABNORMAL LOW (ref 36.0–46.0)
Hemoglobin: 8.6 g/dL — ABNORMAL LOW (ref 12.0–15.0)
MCH: 19.1 pg — ABNORMAL LOW (ref 26.0–34.0)
MCHC: 27.6 g/dL — ABNORMAL LOW (ref 30.0–36.0)
MCV: 69.3 fL — ABNORMAL LOW (ref 80.0–100.0)
Platelets: 420 10*3/uL — ABNORMAL HIGH (ref 150–400)
RBC: 4.5 MIL/uL (ref 3.87–5.11)
RDW: 18.8 % — ABNORMAL HIGH (ref 11.5–15.5)
WBC: 5.4 10*3/uL (ref 4.0–10.5)
nRBC: 0 % (ref 0.0–0.2)

## 2021-09-05 LAB — POC URINE PREG, ED: Preg Test, Ur: NEGATIVE

## 2021-09-05 LAB — LIPASE, BLOOD: Lipase: 29 U/L (ref 11–51)

## 2021-09-05 MED ORDER — ONDANSETRON 4 MG PO TBDP
4.0000 mg | ORAL_TABLET | Freq: Once | ORAL | Status: AC
Start: 2021-09-05 — End: 2021-09-05
  Administered 2021-09-05: 4 mg via ORAL
  Filled 2021-09-05: qty 1

## 2021-09-05 MED ORDER — DICYCLOMINE HCL 10 MG PO CAPS: 10.0000 mg | ORAL_CAPSULE | Freq: Three times a day (TID) | ORAL | 2 refills | Status: DC

## 2021-09-05 MED ORDER — DICYCLOMINE HCL 10 MG PO CAPS
10.0000 mg | ORAL_CAPSULE | Freq: Once | ORAL | Status: AC
  Administered 2021-09-05: 10 mg via ORAL
  Filled 2021-09-05: qty 1

## 2021-09-05 MED ORDER — LOPERAMIDE HCL 2 MG PO TABS: 2.0000 mg | ORAL_TABLET | Freq: Four times a day (QID) | ORAL | 1 refills | Status: DC | PRN

## 2021-09-05 MED ORDER — ONDANSETRON 4 MG PO TBDP: 4.0000 mg | ORAL_TABLET | Freq: Three times a day (TID) | ORAL | 0 refills | Status: DC | PRN

## 2021-09-05 MED ORDER — LOPERAMIDE HCL 2 MG PO CAPS
2.0000 mg | ORAL_CAPSULE | Freq: Once | ORAL | Status: AC
Start: 2021-09-05 — End: 2021-09-05
  Administered 2021-09-05: 2 mg via ORAL
  Filled 2021-09-05: qty 1

## 2021-09-05 MED ORDER — SODIUM CHLORIDE 0.9 % IV BOLUS
1000.0000 mL | Freq: Once | INTRAVENOUS | Status: AC
  Administered 2021-09-05: 1000 mL via INTRAVENOUS

## 2021-09-05 MED ORDER — IOHEXOL 300 MG/ML  SOLN
100.0000 mL | Freq: Once | INTRAMUSCULAR | Status: AC | PRN
  Administered 2021-09-05: 100 mL via INTRAVENOUS

## 2021-09-05 NOTE — ED Provider Notes (Signed)
[facility] Provider Note  Patient Contact: 4:08 PM (approximate)   History   Abdominal Pain   HPI  Sonya Fisher is a 36 y.o. female who presents the emergency department complaining of right-sided abdominal pain.  Patient states that she has a history of irritable bowel syndrome but over the last month she has had ongoing daily symptoms.  Most the time the pain comes and goes, is located in different parts of her abdomen.  Today she has had concentrated pain on the right side of her abdomen.  She has had some nausea, diarrhea.  No blood in her stools.  Patient states that she has urinary pressure but no true dysuria.  No flank pain.  No history of nephrolithiasis.  Patient has no history of colitis, Crohn's, ulcerative colitis, food intolerances.  No medications for this complaint prior to arrival.  Patient states that she still has her appendix and her gallbladder.     Physical Exam   Triage Vital Signs: ED Triage Vitals  Enc Vitals Group     BP 09/05/21 1342 116/85     Pulse Rate 09/05/21 1342 88     Resp 09/05/21 1342 20     Temp 09/05/21 1342 98.1 F (36.7 C)     Temp Source 09/05/21 1342 Oral     SpO2 09/05/21 1342 100 %     Weight 09/05/21 1343 (!) 346 lb (156.9 kg)     Height 09/05/21 1343 '5\' 9"'$  (1.753 m)     Head Circumference --      Peak Flow --      Pain Score 09/05/21 1354 10     Pain Loc --      Pain Edu? --      Excl. in [place]? --     Most recent vital signs: Vitals:   09/05/21 1342 09/05/21 1845  BP: 116/85 124/64  Pulse: 88 82  Resp: 20 18  Temp: 98.1 F (36.7 C) 98.4 F (36.9 C)  SpO2: 100% 100%     General: Alert and in no acute distress.  Cardiovascular:  Good peripheral perfusion Respiratory: Normal respiratory effort without tachypnea or retractions. Lungs CTAB. Good air entry to the bases with no decreased or absent breath sounds. Gastrointestinal: No external abdominal findings.  Bowel sounds 4 quadrants.  Soft to  palpation all quadrants.  Patient is very tender in the right upper and right lower quadrant.  Guarding in both areas.. No other guarding or rigidity. No palpable masses. No distention. No CVA tenderness. Musculoskeletal: Full range of motion to all extremities.  Neurologic:  No gross focal neurologic deficits are appreciated.  Skin:   No rash noted Other:   ED Results / Procedures / Treatments   Labs (all labs ordered are listed, but only abnormal results are displayed) Labs Reviewed  COMPREHENSIVE METABOLIC PANEL - Abnormal; Notable for the following components:      Result Value   Calcium 8.7 (*)    All other components within normal limits  CBC - Abnormal; Notable for the following components:   Hemoglobin 8.6 (*)    HCT 31.2 (*)    MCV 69.3 (*)    MCH 19.1 (*)    MCHC 27.6 (*)    RDW 18.8 (*)    Platelets 420 (*)    All other components within normal limits  URINALYSIS, ROUTINE W REFLEX MICROSCOPIC - Abnormal; Notable for the following components:   Color, Urine YELLOW (*)    APPearance HAZY (*)  Hgb urine dipstick MODERATE (*)    Ketones, ur 5 (*)    Leukocytes,Ua SMALL (*)    All other components within normal limits  LIPASE, BLOOD  POC URINE PREG, ED     EKG     RADIOLOGY  I personally viewed, evaluated, and interpreted these images as part of my medical decision making, as well as reviewing the written report by the radiologist.  ED Provider Interpretation: No acute findings on CT to explain patient's symptoms to include nephrolithiasis, colitis, appendicitis, cholecystitis.  Findings was not well visualized but there is no evidence of other acute inflammatory process in this region.  Nothing to explain patient's symptoms on CT.  CT ABDOMEN PELVIS W CONTRAST  Result Date: 09/05/2021 CLINICAL DATA:  Right lower and right upper quadrant abdominal pain since August 1st, significantly worsened over last 12 hours with nausea and diarrhea EXAM: CT ABDOMEN AND  PELVIS WITH CONTRAST TECHNIQUE: Multidetector CT imaging of the abdomen and pelvis was performed using the standard protocol following bolus administration of intravenous contrast. RADIATION DOSE REDUCTION: This exam was performed according to the departmental dose-optimization program which includes automated exposure control, adjustment of the mA and/or kV according to patient size and/or use of iterative reconstruction technique. CONTRAST:  134m OMNIPAQUE IOHEXOL 300 MG/ML  SOLN COMPARISON:  05/03/2020 FINDINGS: Lower chest: No acute abnormality. Hepatobiliary: No solid liver abnormality is seen. Hepatic steatosis. No gallstones, gallbladder wall thickening, or biliary dilatation. Pancreas: Unremarkable. No pancreatic ductal dilatation or surrounding inflammatory changes. Spleen: Normal in size without significant abnormality. Adrenals/Urinary Tract: Adrenal glands are unremarkable. Kidneys are normal, without renal calculi, solid lesion, or hydronephrosis. Bladder is unremarkable. Stomach/Bowel: Stomach is within normal limits. 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 Appendix is not clearly visualized and may be surgically absent. No evidence of bowel wall thickening, distention, or inflammatory changes. Vascular/Lymphatic: No significant vascular findings are present. No enlarged abdominal or pelvic lymph nodes. Reproductive: No mass or other significant abnormality. Small ovarian follicles. Other: No abdominal wall hernia or abnormality. No ascites. Musculoskeletal: No acute or significant osseous findings. IMPRESSION: 1. No acute CT findings of the abdomen or pelvis to explain right upper or right lower quadrant abdominal pain. 2. 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 3. Appendix is not visualized and may be surgically absent. 4. Hepatic steatosis.  Electronically Signed   By: ADelanna AhmadiM.D.   On: 09/05/2021 18:59    PROCEDURES:  Critical Care performed: No  Procedures   MEDICATIONS ORDERED IN ED: Medications  dicyclomine (BENTYL) capsule 10 mg (has no administration in time range)  loperamide (IMODIUM) capsule 2 mg (has no administration in time range)  ondansetron (ZOFRAN-ODT) disintegrating tablet 4 mg (has no administration in time range)  sodium chloride 0.9 % bolus 1,000 mL (0 mLs Intravenous Stopped 09/05/21 1948)  iohexol (OMNIPAQUE) 300 MG/ML solution 100 mL (100 mLs Intravenous Contrast Given 09/05/21 1826)     IMPRESSION / MDM / ASSESSMENT AND PLAN / ED COURSE  I reviewed the triage vital signs and the nursing notes.                              Differential diagnosis includes, but is not limited to, cholecystitis, appendicitis, colitis, irritable bowel syndrome, nephrolithiasis, pyelonephritis, UTI, gastroenteritis  Patient's presentation is most consistent with  acute presentation with potential threat to life or bodily function.   Patient's diagnosis is consistent with right-sided abdominal pain, IBS.  Patient presents to the ED with a history of irritable bowel syndrome.  She has had increased symptoms on the right side with some nausea as well as diarrhea.  Patient has been symptomatic for the last month.  No reported fevers or chills.  No blood in her stool.  Patient was diffusely tender on the right side of her abdomen.  At this time we ordered labs which are reassuring, imaging which revealed no acute findings on CT scan.  At this time we will treat with symptomatic medications, refer to GI as she does not have a GI provider.  Return precautions discussed with the patient..  Patient is given ED precautions to return to the ED for any worsening or new symptoms.        FINAL CLINICAL IMPRESSION(S) / ED DIAGNOSES   Final diagnoses:  Right sided abdominal pain  Irritable bowel syndrome with diarrhea      Rx / DC Orders   ED Discharge Orders          Ordered    dicyclomine (BENTYL) 10 MG capsule  3 times daily before meals & bedtime        09/05/21 2008    ondansetron (ZOFRAN-ODT) 4 MG disintegrating tablet  Every 8 hours PRN        09/05/21 2008    loperamide (IMODIUM A-D) 2 MG tablet  4 times daily PRN        09/05/21 2008             Note:  This document was prepared using Dragon voice recognition software and may include unintentional dictation errors.   Brynda Peon 09/05/21 2011    Lucillie Garfinkel, MD 09/07/21 [phone]

## 2021-09-05 NOTE — ED Notes (Signed)
Patient declined discharge vital signs. 

## 2021-09-05 NOTE — ED Notes (Signed)
E-signature pad not functional. Patient denied any additional questions.

## 2021-09-05 NOTE — ED Notes (Signed)
Patient was assisted to hallway bathroom via wheelchair by her significant other.

## 2021-09-05 NOTE — ED Triage Notes (Signed)
Pt states that she has been having lower abd since August 1st but it has gotten significantly worse over the last 12 hours- pt was sent over here by the walk in clinic- pt has had nausea and diarrhea these last 12 hours as well

## 2021-09-27 ENCOUNTER — Encounter: Payer: Self-pay | Admitting: Cardiology

## 2021-09-27 ENCOUNTER — Ambulatory Visit: Payer: BC Managed Care – PPO | Attending: Cardiology | Admitting: Cardiology

## 2021-09-27 VITALS — BP 122/82 | HR 62 | Ht 69.0 in | Wt 361.8 lb

## 2021-09-27 DIAGNOSIS — R079 Chest pain, unspecified: Secondary | ICD-10-CM | POA: Diagnosis not present

## 2021-09-27 DIAGNOSIS — R072 Precordial pain: Secondary | ICD-10-CM | POA: Diagnosis not present

## 2021-09-27 NOTE — Patient Instructions (Signed)
Medication Instructions:  Your physician recommends that you continue on your current medications as directed. Please refer to the Current Medication list given to you today.  *If you need a refill on your cardiac medications before your next appointment, please call your pharmacy*   Lab Work: None ordered If you have labs (blood work) drawn today and your tests are completely normal, you will receive your results only by: [place] (if you have MyChart) OR A paper copy in the mail If you have any lab test that is abnormal or we need to change your treatment, we will call you to review the results.   Testing/Procedures: Your physician has requested that you have an echocardiogram. Echocardiography is a painless test that uses sound waves to create images of your heart. It provides your doctor with information about the size and shape of your heart and how well your heart's chambers and valves are working. This procedure takes approximately one hour. There are no restrictions for this procedure.  Your physician has requested that you have a lexiscan myoview. For further information please visit [email or web address]. Please follow instruction sheet, as given.    Follow-Up: At [facility], you and your health needs are our priority.  As part of our continuing mission to provide you with exceptional heart care, we have created designated Provider Care Teams.  These Care Teams include your primary Cardiologist (physician) and Advanced Practice Providers (APPs -  Physician Assistants and Nurse Practitioners) who all work together to provide you with the care you need, when you need it.  We recommend signing up for the patient portal called "MyChart".  Sign up information is provided on this After Visit Summary.  MyChart is used to connect with patients for Virtual Visits (Telemedicine).  Patients are able to view lab/test results, encounter notes, upcoming appointments, etc.   Non-urgent messages can be sent to your provider as well.   To learn more about what you can do with MyChart, go to [email or web address].    Your next appointment:   After testing  The format for your next appointment:   In Person  Provider:   You may see Kate Sable, MD or one of the following Advanced Practice Providers on your designated Care Team:   Murray Hodgkins, NP Christell Faith, PA-C Cadence Kathlen Mody, PA-C Gerrie Nordmann, NP    Other Instructions Sonya Fisher  Your caregiver has ordered a Stress Test with nuclear imaging. The purpose of this test is to evaluate the blood supply to your heart muscle. This procedure is referred to as a "Non-Invasive Stress Test." This is because other than having an IV started in your vein, nothing is inserted or "invades" your body. Cardiac stress tests are done to find areas of poor blood flow to the heart by determining the extent of coronary artery disease (CAD). Some patients exercise on a treadmill, which naturally increases the blood flow to your heart, while others who are  unable to walk on a treadmill due to physical limitations have a pharmacologic/chemical stress agent called Lexiscan . This medicine will mimic walking on a treadmill by temporarily increasing your coronary blood flow.   Please note: these test may take anywhere between 2-4 hours to complete  PLEASE REPORT TO [place] AT THE FIRST DESK WILL DIRECT YOU WHERE TO GO  Date of Procedure:_____________________________________  Arrival Time for Procedure:______________________________   PLEASE NOTIFY THE OFFICE AT LEAST 24 HOURS IN ADVANCE IF YOU ARE UNABLE  TO KEEP YOUR APPOINTMENT.  [phone] AND  PLEASE NOTIFY NUCLEAR MEDICINE AT [facility] AT LEAST 24 HOURS IN ADVANCE IF YOU ARE UNABLE TO KEEP YOUR APPOINTMENT. [phone]  How to prepare for your Myoview test:  Do not eat or drink after midnight No caffeine for 24 hours prior to  test No smoking 24 hours prior to test. Your medication may be taken with water.  If your doctor stopped a medication because of this test, do not take that medication. Ladies, please do not wear dresses.  Skirts or pants are appropriate. Please wear a short sleeve shirt. No perfume, cologne or lotion. Wear comfortable walking shoes. No heels!

## 2021-09-27 NOTE — Progress Notes (Signed)
Cardiology Office Note:    Date:  09/27/2021   ID:  Sonya Fisher, DOB 1985/04/17, MRN [id number]  PCP:  Angelene Giovanni [place] Prov[facility] Cardiologist:  Kate Sable, MD     Referring MD: Teodoro Spray, PA   Chief Complaint  Patient presents with   New Patient (Initial Visit)    Chest pain 3x month, Family Hx   Sonya Fisher is a 36 y.o. female who is being seen today for the evaluation of chest pain at the request of Teodoro Spray, [place].   History of Present Illness:    Sonya Fisher is a 36 y.o. female with a hx of anxiety, obesity who presents due to chest pain.  Symptoms of chest pain have been ongoing over the past 2 to 3 months.  Symptoms are not related with exertion, occur almost daily whenever patient is stressed or anxious.  Was evaluated in the emergency room, work-up was unrevealing.  Denies smoking, denies any family history of heart attacks.  Advised to follow-up with cardiology after last ED visit.  Past Medical History:  Diagnosis Date   Anxiety    Panic attacks     History reviewed. No pertinent surgical history.  Current Medications: Current Meds  Medication Sig   aspirin 81 MG chewable tablet Chew 81 mg by mouth once.   clindamycin (CLEOCIN) 150 MG capsule Take 150 mg by mouth 3 (three) times daily.   dicyclomine (BENTYL) 10 MG capsule Take 1 capsule (10 mg total) by mouth 4 (four) times daily -  before meals and at bedtime.   escitalopram (LEXAPRO) 20 MG tablet Take 20 mg by mouth daily.   ferrous gluconate (FERGON) 324 MG tablet Take 1 tablet by mouth daily with breakfast.   HYDROcodone bit-homatropine (HYCODAN) 5-1.5 MG/5ML syrup Take 5 mLs by mouth every 6 (six) hours as needed for cough.   tiZANidine (ZANAFLEX) 4 MG tablet TAKE 1 TABLET BY MOUTH THREE TIMES DAILY AS NEEDED FOR MUSCLE SPASM UP TO FOR 30 DOSES     Allergies:   Augmentin [amoxicillin-pot clavulanate], Sulfa antibiotics, Transact [elemental  sulfur], Amoxicillin, Keflex [cephalexin], and Penicillins   Social History   Socioeconomic History   Marital status: Married    Spouse name: Not on file   Number of children: Not on file   Years of education: Not on file   Highest education level: Not on file  Occupational History   Not on file  Tobacco Use   Smoking status: Never   Smokeless tobacco: Never  Vaping Use   Vaping Use: Never used  Substance and Sexual Activity   Alcohol use: Not Currently   Drug use: Never   Sexual activity: Not on file  Other Topics Concern   Not on file  Social History Narrative   Not on file   Social Determinants of Health   Financial Resource Strain: Not on file  Food Insecurity: Not on file  Transportation Needs: Not on file  Physical Activity: Not on file  Stress: Not on file  Social Connections: Not on file     Family History: The patient's family history includes Heart disease in her mother.  ROS:   Please see the history of present illness.     All other systems reviewed and are negative.  EKGs/Labs/Other Studies Reviewed:    The following studies were reviewed today:   EKG:  EKG is  ordered today.  The ekg ordered today demonstrates normal sinus rhythm, normal ECG  Recent Labs: 09/05/2021: ALT 16; BUN 9; Creatinine, Ser 0.72; Hemoglobin 8.6; Platelets 420; Potassium 4.0; Sodium 137  Recent Lipid Panel No results found for: "CHOL", "TRIG", "HDL", "CHOLHDL", "VLDL", "LDLCALC", "LDLDIRECT"   Risk Assessment/Calculations:             Physical Exam:    VS:  BP 122/82 (BP Location: Right Arm)   Pulse 62   Ht '5\' 9"'$  (1.753 m)   Wt (!) 361 lb 12.8 oz (164.1 kg)   SpO2 98%   BMI 53.43 kg/m     Wt Readings from Last 3 Encounters:  09/27/21 (!) 361 lb 12.8 oz (164.1 kg)  09/05/21 (!) 346 lb (156.9 kg)  08/23/21 (!) 330 lb (149.7 kg)     GEN:  Well nourished, well developed in no acute distress HEENT: Normal NECK: No JVD; No carotid bruits CARDIAC: RRR, no  murmurs, rubs, gallops RESPIRATORY:  Clear to auscultation without rales, wheezing or rhonchi  ABDOMEN: Soft, non-tender, non-distended MUSCULOSKELETAL:  No edema; chest tenderness on palpation SKIN: Warm and dry NEUROLOGIC:  Alert and oriented x 3 PSYCHIATRIC:  Normal affect   ASSESSMENT:    1. Precordial pain   2. Morbid obesity ([place])   3. Chest pain of uncertain etiology    PLAN:    In order of problems listed above:  Chest pain, appears atypical, likely musculoskeletal, reproducible with palpation of chest wall. risk factors obesity.  Obtain echocardiogram, obtain Myoview.  If normal, will reassure patient.  Advised to follow-up with PCP regarding musculoskeletal pain. Morbid obesity, low-calorie diet, weight loss advised.  Follow-up after cardiac testing.      Shared Decision Making/Informed Consent The risks [chest pain, shortness of breath, cardiac arrhythmias, dizziness, blood pressure fluctuations, myocardial infarction, stroke/transient ischemic attack, nausea, vomiting, allergic reaction, radiation exposure, metallic taste sensation and life-threatening complications (estimated to be 1 in 10,000)], benefits (risk stratification, diagnosing coronary artery disease, treatment guidance) and alternatives of a nuclear stress test were discussed in detail with Ms. Nikolic and she agrees to proceed.    Medication Adjustments/Labs and Tests Ordered: Current medicines are reviewed at length with the patient today.  Concerns regarding medicines are outlined above.  Orders Placed This Encounter  Procedures   NM Myocar Multi W/Spect W/Wall Motion / EF   EKG 12-Lead   ECHOCARDIOGRAM COMPLETE   No orders of the defined types were placed in this encounter.   Patient Instructions  Medication Instructions:  Your physician recommends that you continue on your current medications as directed. Please refer to the Current Medication list given to you today.  *If you need a refill on  your cardiac medications before your next appointment, please call your pharmacy*   Lab Work: None ordered If you have labs (blood work) drawn today and your tests are completely normal, you will receive your results only by: [place] (if you have MyChart) OR A paper copy in the mail If you have any lab test that is abnormal or we need to change your treatment, we will call you to review the results.   Testing/Procedures: Your physician has requested that you have an echocardiogram. Echocardiography is a painless test that uses sound waves to create images of your heart. It provides your doctor with information about the size and shape of your heart and how well your heart's chambers and valves are working. This procedure takes approximately one hour. There are no restrictions for this procedure.  Your physician has requested that you have a lexiscan  myoview. For further information please visit [email or web address]. Please follow instruction sheet, as given.    Follow-Up: At [facility], you and your health needs are our priority.  As part of our continuing mission to provide you with exceptional heart care, we have created designated Provider Care Teams.  These Care Teams include your primary Cardiologist (physician) and Advanced Practice Providers (APPs -  Physician Assistants and Nurse Practitioners) who all work together to provide you with the care you need, when you need it.  We recommend signing up for the patient portal called "MyChart".  Sign up information is provided on this After Visit Summary.  MyChart is used to connect with patients for Virtual Visits (Telemedicine).  Patients are able to view lab/test results, encounter notes, upcoming appointments, etc.  Non-urgent messages can be sent to your provider as well.   To learn more about what you can do with MyChart, go to [email or web address].    Your next appointment:   After testing  The format for your  next appointment:   In Person  Provider:   You may see Kate Sable, MD or one of the following Advanced Practice Providers on your designated Care Team:   Murray Hodgkins, NP Christell Faith, PA-C Cadence Kathlen Mody, PA-C Gerrie Nordmann, NP    Other Instructions [place]  Your caregiver has ordered a Stress Test with nuclear imaging. The purpose of this test is to evaluate the blood supply to your heart muscle. This procedure is referred to as a "Non-Invasive Stress Test." This is because other than having an IV started in your vein, nothing is inserted or "invades" your body. Cardiac stress tests are done to find areas of poor blood flow to the heart by determining the extent of coronary artery disease (CAD). Some patients exercise on a treadmill, which naturally increases the blood flow to your heart, while others who are  unable to walk on a treadmill due to physical limitations have a pharmacologic/chemical stress agent called Lexiscan . This medicine will mimic walking on a treadmill by temporarily increasing your coronary blood flow.   Please note: these test may take anywhere between 2-4 hours to complete  PLEASE REPORT TO [place] AT THE FIRST DESK WILL DIRECT YOU WHERE TO GO  Date of Procedure:_____________________________________  Arrival Time for Procedure:______________________________   PLEASE NOTIFY THE OFFICE AT LEAST 24 HOURS IN ADVANCE IF YOU ARE UNABLE TO KEEP YOUR APPOINTMENT.  [phone] AND  PLEASE NOTIFY NUCLEAR MEDICINE AT [facility] AT LEAST 24 HOURS IN ADVANCE IF YOU ARE UNABLE TO KEEP YOUR APPOINTMENT. [phone]  How to prepare for your Myoview test:  Do not eat or drink after midnight No caffeine for 24 hours prior to test No smoking 24 hours prior to test. Your medication may be taken with water.  If your doctor stopped a medication because of this test, do not take that medication. Ladies, please do not wear dresses.   Skirts or pants are appropriate. Please wear a short sleeve shirt. No perfume, cologne or lotion. Wear comfortable walking shoes. No heels!       Signed, Kate Sable, MD  09/27/2021 9:20 AM    [place]

## 2021-10-04 ENCOUNTER — Encounter
Admission: RE | Admit: 2021-10-04 | Discharge: 2021-10-04 | Disposition: A | Discharge status: Home / Self Care | Payer: BC Managed Care – PPO | Source: Ambulatory Visit | Attending: Cardiology | Admitting: Cardiology

## 2021-10-04 DIAGNOSIS — R079 Chest pain, unspecified: Secondary | ICD-10-CM | POA: Diagnosis not present

## 2021-10-04 DIAGNOSIS — R072 Precordial pain: Secondary | ICD-10-CM | POA: Diagnosis not present

## 2021-10-04 LAB — NM MYOCAR SINGLE W/SPECT
Base ST Depression (mm): 0 mm
Peak HR: 108 {beats}/min
Rest HR: 70 {beats}/min
ST Depression (mm): 0 mm

## 2021-10-04 MED ORDER — REGADENOSON 0.4 MG/5ML IV SOLN
0.4000 mg | Freq: Once | INTRAVENOUS | Status: AC
  Administered 2021-10-04: 0.4 mg via INTRAVENOUS
  Filled 2021-10-04: qty 5

## 2021-10-04 MED ORDER — TECHNETIUM TC 99M TETROFOSMIN IV KIT
30.0000 | PACK | Freq: Once | INTRAVENOUS | Status: AC | PRN
  Administered 2021-10-04: 29.71 via INTRAVENOUS

## 2021-10-08 ENCOUNTER — Other Ambulatory Visit: Payer: Self-pay | Admitting: Cardiology

## 2021-10-08 DIAGNOSIS — R079 Chest pain, unspecified: Secondary | ICD-10-CM

## 2021-10-08 DIAGNOSIS — R072 Precordial pain: Secondary | ICD-10-CM

## 2021-10-11 ENCOUNTER — Ambulatory Visit: Payer: BC Managed Care – PPO | Admitting: Cardiology

## 2021-10-26 ENCOUNTER — Ambulatory Visit: Payer: BC Managed Care – PPO | Attending: Cardiology

## 2021-10-26 DIAGNOSIS — R079 Chest pain, unspecified: Secondary | ICD-10-CM | POA: Diagnosis not present

## 2021-10-26 LAB — ECHOCARDIOGRAM COMPLETE
AR max vel: 2.91 cm2
AV Area VTI: 2.8 cm2
AV Area mean vel: 2.9 cm2
AV Mean grad: 5 mmHg
AV Peak grad: 8.4 mmHg
Ao pk vel: 1.45 m/s
Area-P 1/2: 3.17 cm2
S' Lateral: 3.4 cm

## 2021-10-26 MED ORDER — PERFLUTREN LIPID MICROSPHERE
1.0000 mL | INTRAVENOUS | Status: AC | PRN
  Administered 2021-10-26: 2 mL via INTRAVENOUS

## 2021-10-27 NOTE — Progress Notes (Signed)
Cardiology Clinic Note   Patient Name: Sonya Fisher Date of Encounter: 10/28/2021  Primary Care Provider:  [facility] Primary Cardiologist:  Kate Sable, MD  Patient Profile    36 year old female with history of anxiety, obesity, who is here today to follow-up on recent concerns of chest discomfort, shortness of breath, and fatigue.  Past Medical History    Past Medical History:  Diagnosis Date   Anxiety    Breast cancer ([place])    Panic attacks    History reviewed. No pertinent surgical history.  Allergies  Allergies  Allergen Reactions   Sulfa Antibiotics    Amoxicillin Rash   Amoxicillin-Pot Clavulanate Rash   Elemental Sulfur Rash   Keflex [Cephalexin] Rash   Penicillin G Rash   Penicillins Rash    History of Present Illness    Sonya Fisher is a 36 year old female with a past medical history of anxiety and obesity who was recently evaluated in the emergency department for chest discomfort.  She has no prior cardiac history.  She presented to the [facility] emergency department 08/23/2021 with complaints of chest pain, shortness of breath, dizziness, weakness, and abdominal cramping.  Her chest pain was reproducible with palpation.  She recently noticed an increased amount of cramps and vaginal bleeding.  She had previously been treated in the emergency room on 08/11/21 with the medroxyprogesterone and iron supplementation.  Her hemoglobin was stable at 9.8 in the emergency department.  Repeat hemoglobin in September was 8.6.  She was last seen in clinic by Dr. Garen Lah on 09/27/2021.  With her symptoms she was scheduled for Myoview and echocardiogram.  MPI was also low risk study, LV perfusion is normal, there is no evidence of ischemia or infarction.  Echocardiogram revealed LVEF of 60-65%, no regional wall motion abnormalities, and no valvular disease.  She returns to clinic today accompanied by her husband with continued complaints of fatigue,  occasional chest pressure, and shortness of breath.  She states that she does follow-up with oncology.  She said that recently she was placed on iron therapy to take once a day for low iron levels but was unsure.  Was reviewing her labs her iron was 17.  She seems to be tolerating the once daily dosing but would likely require twice daily dosing.  She said she knew that she was anemic but she was unsure what her blood count was blood count has dropped August into September.  She states she has not had any other lab work that has been scheduled but she does have multiple follow-up appointments that are upcoming.  She denies any further visits to the hospital or emergency department.  Home Medications    Current Outpatient Medications  Medication Sig Dispense Refill   aspirin 81 MG chewable tablet Chew 81 mg by mouth once.     busPIRone (BUSPAR) 5 MG tablet Take 5 mg by mouth 2 (two) times daily.     escitalopram (LEXAPRO) 20 MG tablet Take 20 mg by mouth daily.     ferrous gluconate (FERGON) 324 MG tablet Take 1 tablet by mouth daily with breakfast.     metoCLOPramide (REGLAN) 5 MG tablet Take 1 tablet (5 mg total) by mouth every 8 (eight) hours as needed for up to 10 days for nausea or vomiting. 30 tablet 0   No current facility-administered medications for this visit.     Family History    Family History  Problem Relation Age of Onset   Heart  disease Mother    She indicated that her mother is alive.  Social History    Social History   Socioeconomic History   Marital status: Married    Spouse name: Not on file   Number of children: Not on file   Years of education: Not on file   Highest education level: Not on file  Occupational History   Not on file  Tobacco Use   Smoking status: Never   Smokeless tobacco: Never  Vaping Use   Vaping Use: Never used  Substance and Sexual Activity   Alcohol use: Not Currently   Drug use: Never   Sexual activity: Not on file  Other Topics  Concern   Not on file  Social History Narrative   Not on file   Social Determinants of Health   Financial Resource Strain: Not on file  Food Insecurity: Not on file  Transportation Needs: Not on file  Physical Activity: Not on file  Stress: Not on file  Social Connections: Not on file  Intimate Partner Violence: Not on file     Review of Systems    General:  No chills, fever, night sweats or weight changes.  Endorses fatigue Cardiovascular:  No chest pain, dyspnea on exertion, edema, orthopnea, palpitations, paroxysmal nocturnal dyspnea.  Endorses chest pressure and shortness of breath on occasion Dermatological: No rash, lesions/masses Respiratory: No cough, dyspnea Urologic: No hematuria, dysuria Abdominal:   No nausea, vomiting, diarrhea, bright red blood per rectum, melena, or hematemesis Neurologic:  No visual changes, wkns, changes in mental status. All other systems reviewed and are otherwise negative except as noted above.   Physical Exam    VS:  BP 120/82 (BP Location: Left Arm, Patient Position: Sitting, Cuff Size: Large)   Pulse 75   Ht '5\' 9"'$  (1.753 m)   Wt (!) 359 lb (162.8 kg)   LMP 09/20/2021 Comment: Pt said LMP 2 weeks ago Not taking birth control  SpO2 99%   BMI 53.02 kg/m  , BMI Body mass index is 53.02 kg/m.     GEN: Well nourished, well developed, in no acute distress. HEENT: normal.  Glasses Neck: Supple, no JVD, carotid bruits, or masses. Cardiac: RRR, no murmurs, rubs, or gallops. No clubbing, cyanosis, edema.  Radials/DP/PT 2+ and equal bilaterally.  Respiratory:  Respirations regular and unlabored, clear to auscultation bilaterally. GI: Soft, nontender, nondistended, obese, BS + x 4. MS: no deformity or atrophy. Skin: warm and dry, no rash. Neuro:  Strength and sensation are intact. Psych: Normal affect.  Accessory Clinical Findings    ECG personally reviewed by me today-no new tracings completed today  Lab Results  Component Value Date    WBC 5.4 09/05/2021   HGB 8.6 (L) 09/05/2021   HCT 31.2 (L) 09/05/2021   MCV 69.3 (L) 09/05/2021   PLT 420 (H) 09/05/2021   Lab Results  Component Value Date   CREATININE 0.72 09/05/2021   BUN 9 09/05/2021   NA 137 09/05/2021   K 4.0 09/05/2021   CL 105 09/05/2021   CO2 22 09/05/2021   Lab Results  Component Value Date   ALT 16 09/05/2021   AST 16 09/05/2021   ALKPHOS 78 09/05/2021   BILITOT 0.8 09/05/2021   No results found for: "CHOL", "HDL", "LDLCALC", "LDLDIRECT", "TRIG", "CHOLHDL"  No results found for: "HGBA1C"  Assessment & Plan   1.  Atypical chest pain that is reproducible with palpation of the chest wall.  Likely musculoskeletal.  Echocardiogram and MPI normal.  With review of labs it was noted that she is anemic with a hemoglobin at 8.6.  After discussing all test results discussed lab work.  Advised patient that anemia can make her short of breath, chest pain and fatigue.  She is also noted that she is continue to have nosebleeds recently.  We did discuss repeating her CBC today but she has an appointment with her PCP and oncology tomorrow and on Monday she has other appointments.  Set up labs were ordered today.  2.  Morbid obesity advised to continue with activity as tolerated  3.  Anemia with a last hemoglobin of 8.6.  This continues to be followed by PCP and oncology.  She is on iron therapy.  4.  Disposition patient to return to clinic to see MD/APP in 3 months or sooner if needed to follow-up on symptoms.  Alys Dulak, NP 10/28/2021, 9:29 AM

## 2021-10-28 ENCOUNTER — Ambulatory Visit: Payer: BC Managed Care – PPO | Attending: Cardiology | Admitting: Cardiology

## 2021-10-28 ENCOUNTER — Encounter: Payer: Self-pay | Admitting: Cardiology

## 2021-10-28 VITALS — BP 120/82 | HR 75 | Ht 69.0 in | Wt 359.0 lb

## 2021-10-28 DIAGNOSIS — R079 Chest pain, unspecified: Secondary | ICD-10-CM

## 2021-10-28 DIAGNOSIS — D509 Iron deficiency anemia, unspecified: Secondary | ICD-10-CM

## 2021-10-28 NOTE — Patient Instructions (Signed)
Medication Instructions:  Your physician recommends that you continue on your current medications as directed. Please refer to the Current Medication list given to you today.  *If you need a refill on your cardiac medications before your next appointment, please call your pharmacy*   Lab Work: NONE If you have labs (blood work) drawn today and your tests are completely normal, you will receive your results only by: [place] (if you have MyChart) OR A paper copy in the mail If you have any lab test that is abnormal or we need to change your treatment, we will call you to review the results.   Testing/Procedures: NONE   Follow-Up: At [facility], you and your health needs are our priority.  As part of our continuing mission to provide you with exceptional heart care, we have created designated Provider Care Teams.  These Care Teams include your primary Cardiologist (physician) and Advanced Practice Providers (APPs -  Physician Assistants and Nurse Practitioners) who all work together to provide you with the care you need, when you need it.  We recommend signing up for the patient portal called "MyChart".  Sign up information is provided on this After Visit Summary.  MyChart is used to connect with patients for Virtual Visits (Telemedicine).  Patients are able to view lab/test results, encounter notes, upcoming appointments, etc.  Non-urgent messages can be sent to your provider as well.   To learn more about what you can do with MyChart, go to [email or web address].    Your next appointment:   3 month(s)  The format for your next appointment:   In Person  Provider:   Gerrie Nordmann, NP      Important Information About [place]

## 2021-11-09 IMAGING — CR DG CHEST 2V
1 series · 2 of 2 positions shown · non-contrast
Comparison: None.

CLINICAL DATA: Chest pain, productive cough

EXAM:
CHEST - 2 VIEW

[Series 1: dg chest 2 view · 0.14mm/px · 2 of 2 slices shown]
[im 1/2]
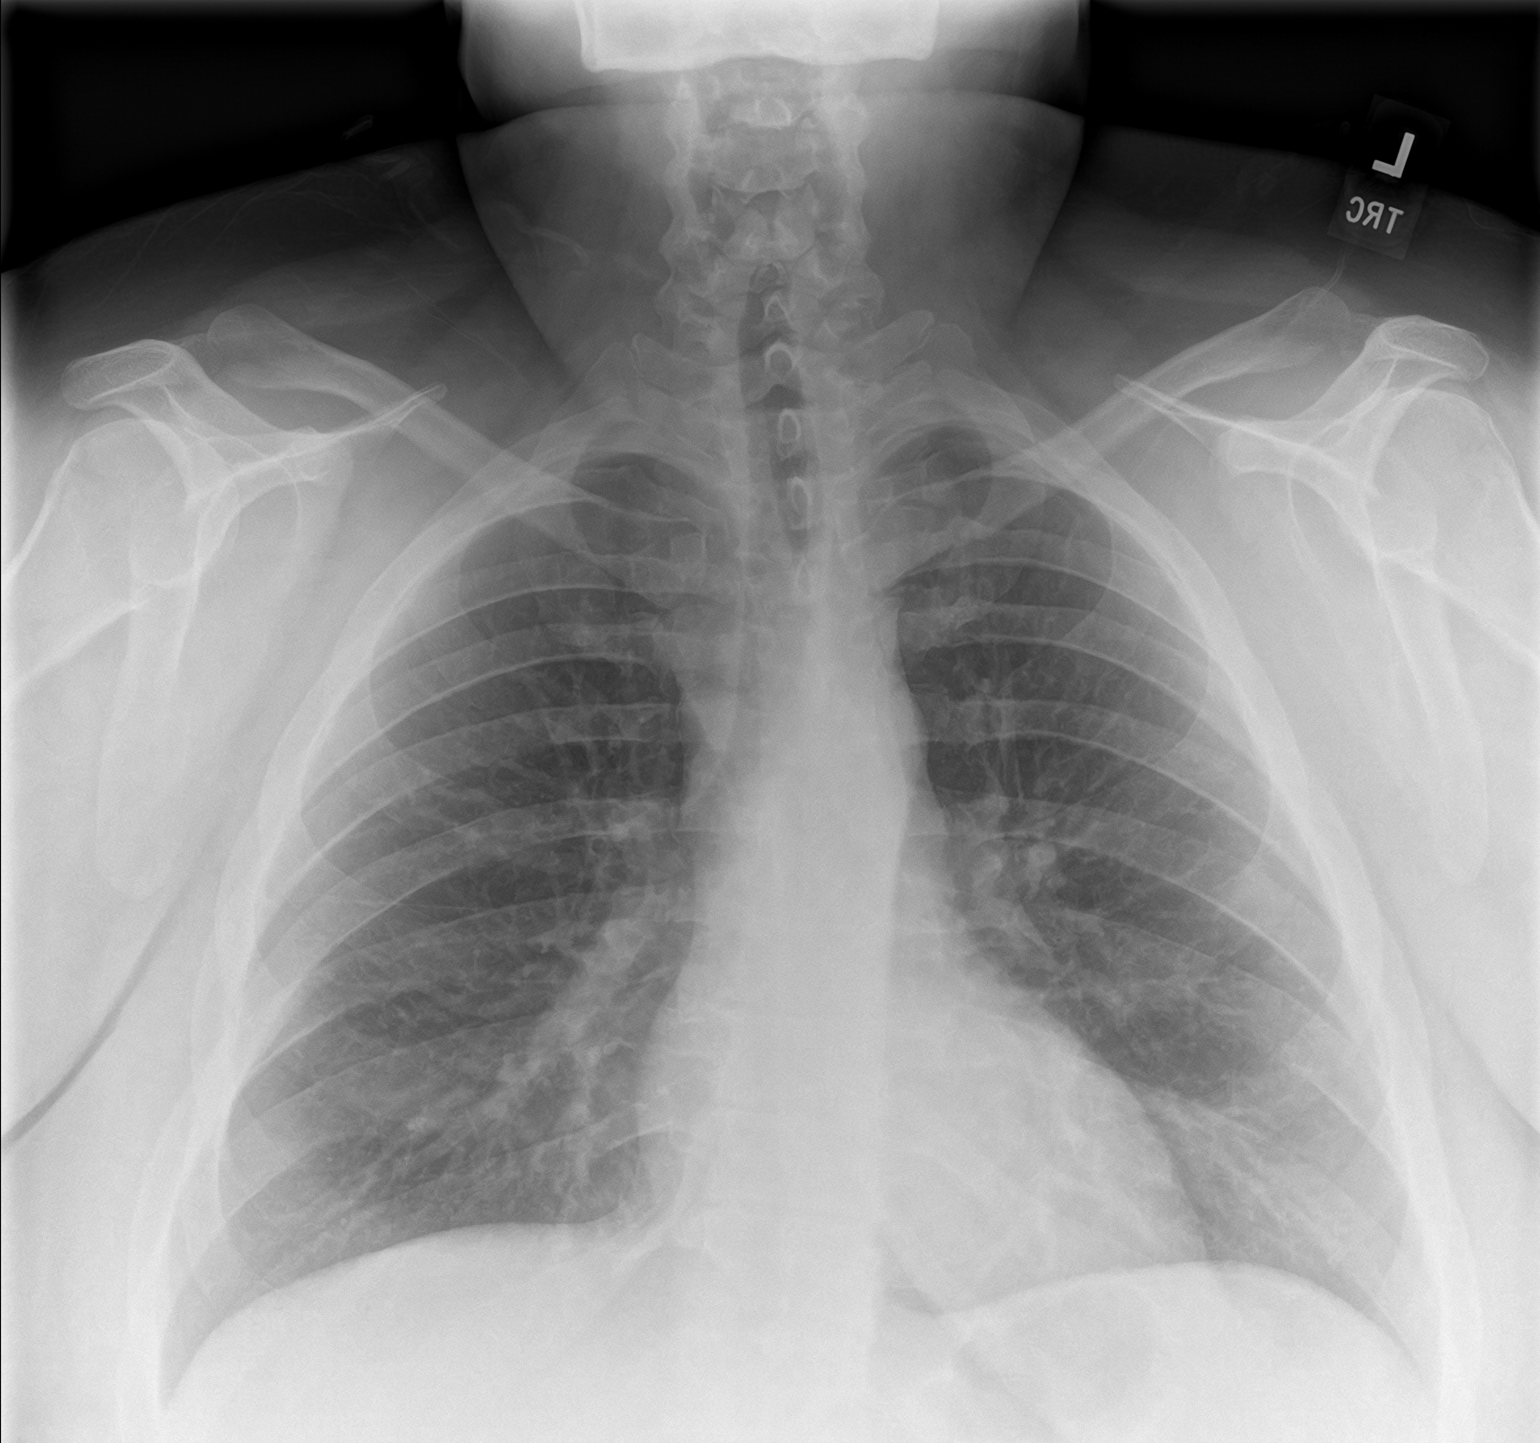
[im 2/2]
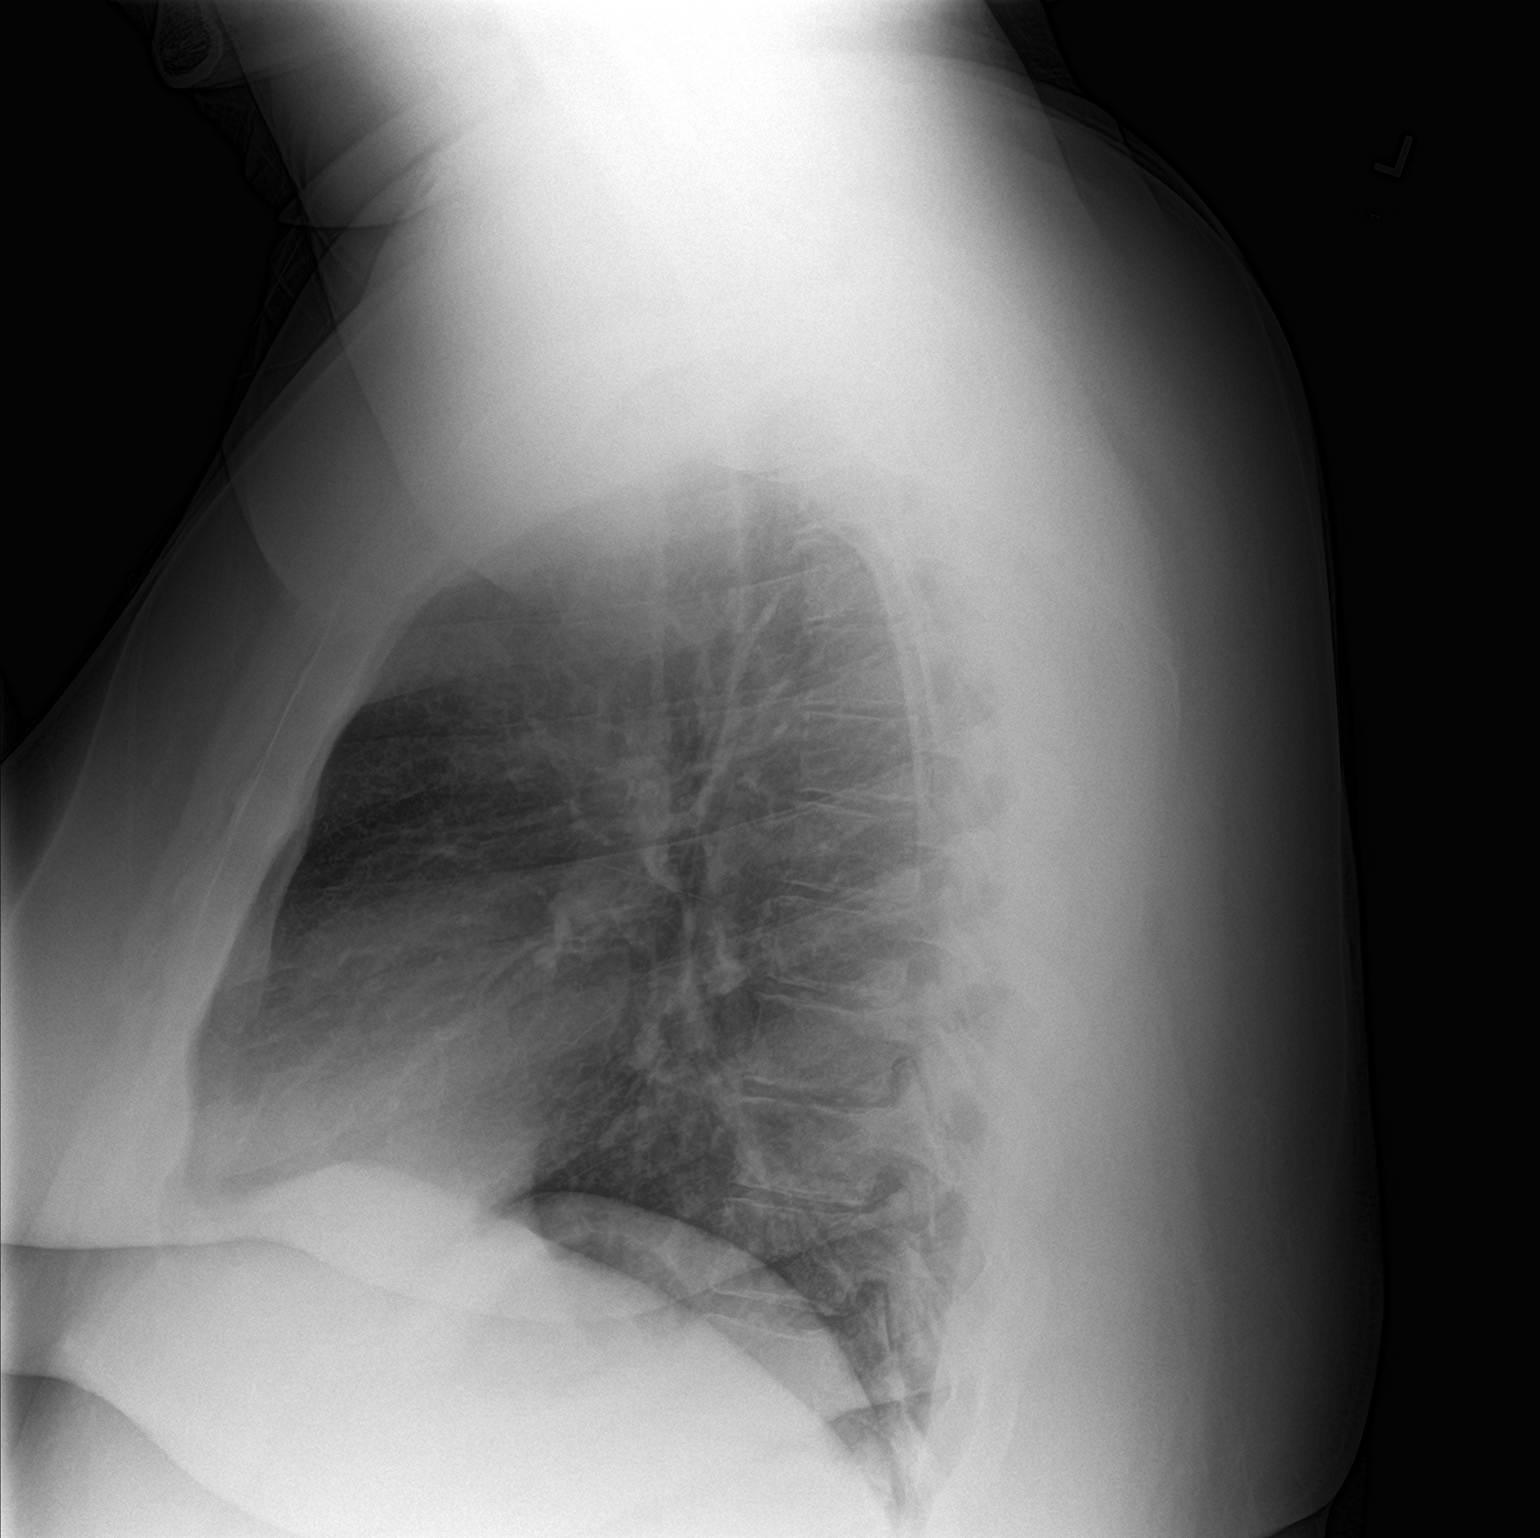

[2 of 2 positions shown; findings below may reference images not displayed]

FINDINGS: Focal opacity at the left lung base represents a confluence of
vascular and osseous shadows. The lungs are clear. No pneumothorax
or pleural effusion. Cardiac size within normal limits. Pulmonary
vascularity is normal. No acute bone abnormality.
IMPRESSION: No active cardiopulmonary disease.

## 2022-01-17 IMAGING — CR DG CHEST 2V
1 series · 2 of 2 positions shown · non-contrast
Comparison: 10/15/2020

CLINICAL DATA: Central chest pain for 2 days, initial encounter

EXAM:
CHEST - 2 VIEW

[Series 1: dg chest 2 view · 0.14mm/px · 2 of 2 slices shown]
[im 1/2]
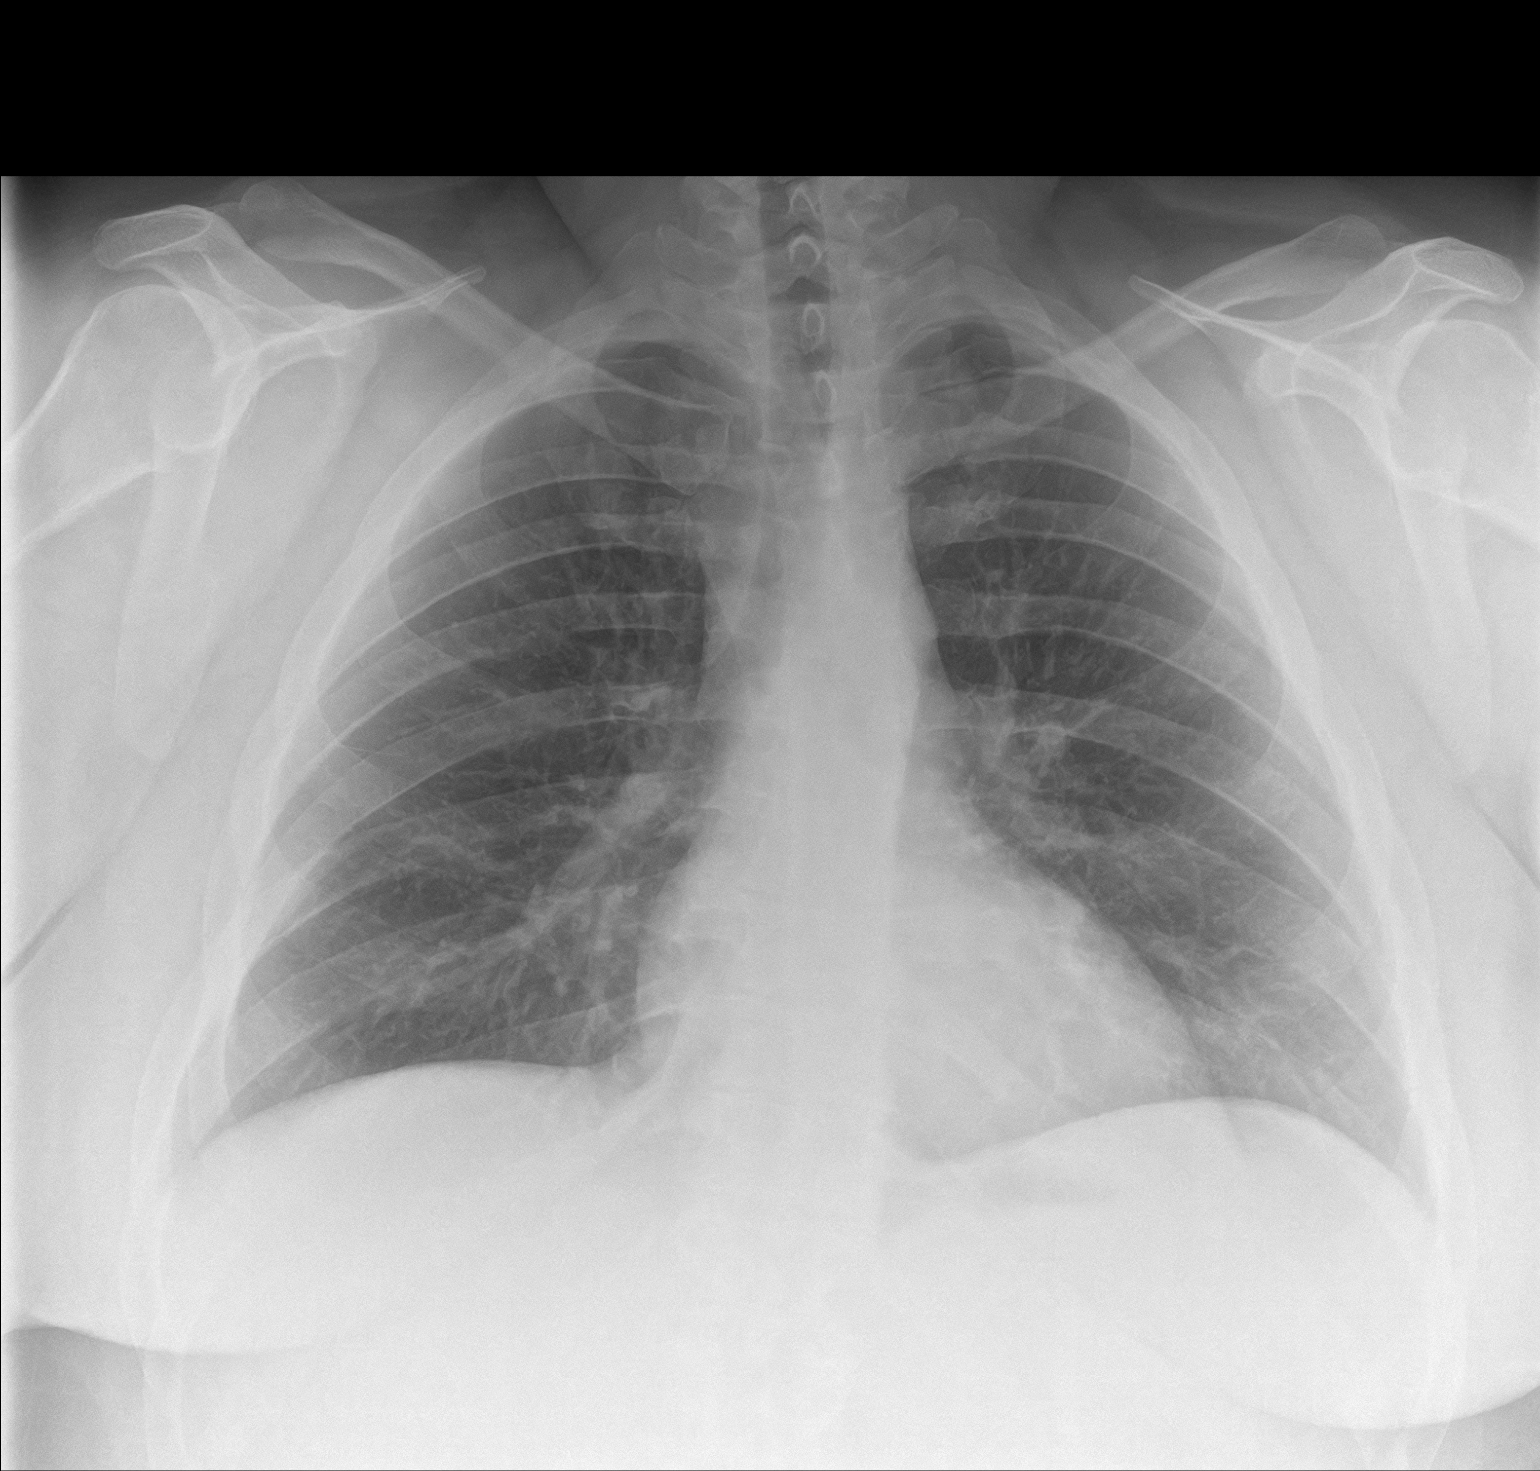
[im 2/2]
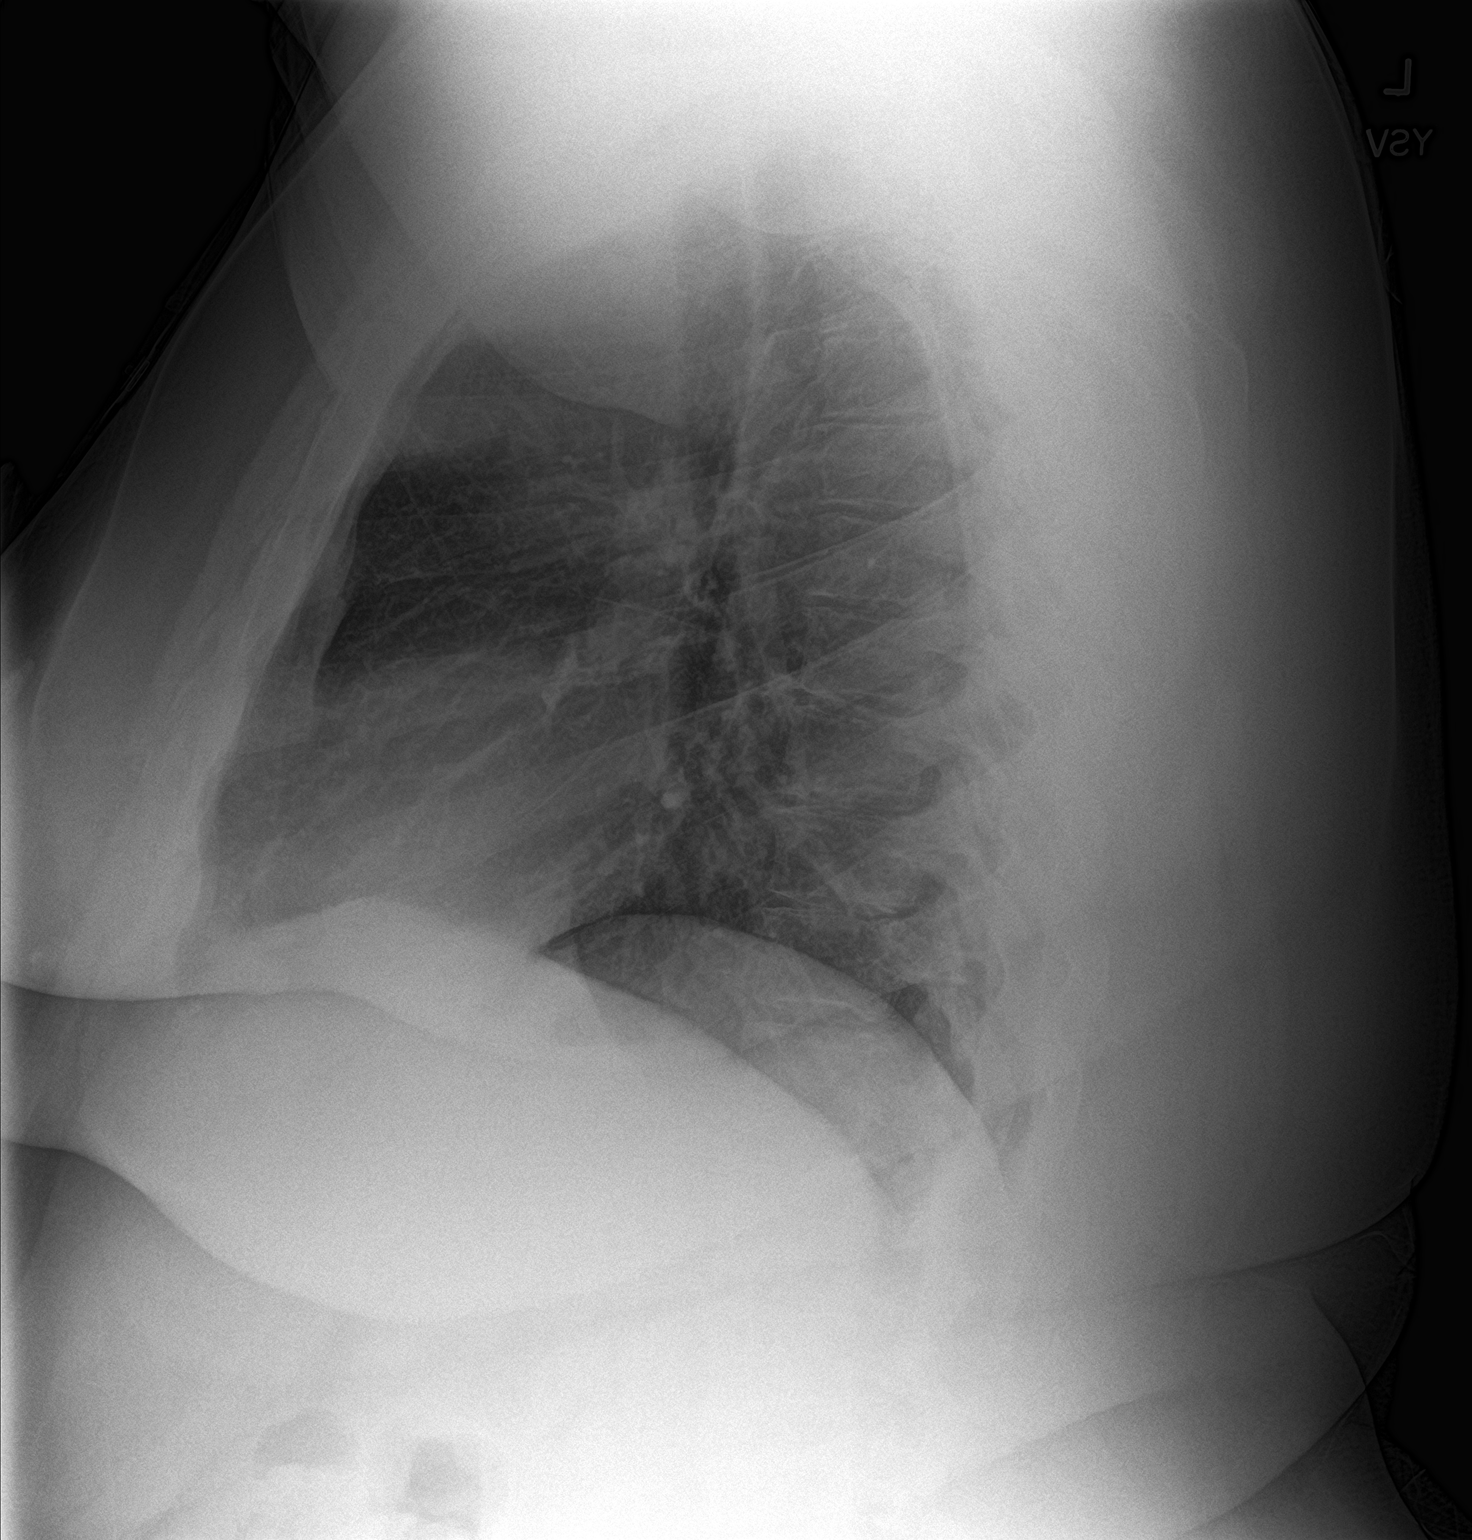

[2 of 2 positions shown; findings below may reference images not displayed]

FINDINGS: The heart size and mediastinal contours are within normal limits.
Both lungs are clear. The visualized skeletal structures are
unremarkable.
IMPRESSION: No active cardiopulmonary disease.

## 2022-01-28 ENCOUNTER — Ambulatory Visit: Payer: BC Managed Care – PPO | Admitting: Cardiology

## 2022-02-24 ENCOUNTER — Other Ambulatory Visit: Payer: Self-pay | Admitting: Family Medicine

## 2022-02-24 DIAGNOSIS — R0609 Other forms of dyspnea: Secondary | ICD-10-CM

## 2022-07-01 ENCOUNTER — Other Ambulatory Visit: Payer: Self-pay

## 2022-07-01 ENCOUNTER — Emergency Department
Admission: EM | Admit: 2022-07-01 | Discharge: 2022-07-01 | Disposition: A | Discharge status: Home / Self Care | Payer: BC Managed Care – PPO | Attending: Emergency Medicine | Admitting: Emergency Medicine

## 2022-07-01 DIAGNOSIS — Z7982 Long term (current) use of aspirin: Secondary | ICD-10-CM | POA: Diagnosis not present

## 2022-07-01 DIAGNOSIS — N939 Abnormal uterine and vaginal bleeding, unspecified: Secondary | ICD-10-CM | POA: Insufficient documentation

## 2022-07-01 LAB — URINALYSIS, COMPLETE (UACMP) WITH MICROSCOPIC
Bacteria, UA: NONE SEEN
RBC / HPF: >50 RBC/hpf (ref 0–5)
Squamous Epithelial / HPF: NONE SEEN /HPF (ref 0–5)

## 2022-07-01 LAB — BASIC METABOLIC PANEL
Anion gap: 10 (ref 5–15)
BUN: 12 mg/dL (ref 6–20)
CO2: 21 mmol/L — ABNORMAL LOW (ref 22–32)
Calcium: 8.8 mg/dL — ABNORMAL LOW (ref 8.9–10.3)
Chloride: 102 mmol/L (ref 98–111)
Creatinine, Ser: 0.83 mg/dL (ref 0.44–1.00)
GFR, Estimated: >60 mL/min (ref >60)
Glucose, Bld: 93 mg/dL (ref 70–99)
Potassium: 3.7 mmol/L (ref 3.5–5.1)
Sodium: 133 mmol/L — ABNORMAL LOW (ref 135–145)

## 2022-07-01 LAB — CBC
HCT: 45.8 % (ref 36.0–46.0)
Hemoglobin: 14.6 g/dL (ref 12.0–15.0)
MCH: 26.6 pg (ref 26.0–34.0)
MCHC: 31.9 g/dL (ref 30.0–36.0)
MCV: 83.4 fL (ref 80.0–100.0)
Platelets: 332 10*3/uL (ref 150–400)
RBC: 5.49 MIL/uL — ABNORMAL HIGH (ref 3.87–5.11)
RDW: 13 % (ref 11.5–15.5)
WBC: 7 10*3/uL (ref 4.0–10.5)
nRBC: 0 % (ref 0.0–0.2)

## 2022-07-01 LAB — POC URINE PREG, ED: Preg Test, Ur: NEGATIVE

## 2022-07-01 MED ORDER — MEDROXYPROGESTERONE ACETATE 10 MG PO TABS: 10.0000 mg | ORAL_TABLET | Freq: Three times a day (TID) | ORAL | 0 refills | Status: AC

## 2022-07-01 MED ORDER — MEDROXYPROGESTERONE ACETATE 10 MG PO TABS
10.0000 mg | ORAL_TABLET | Freq: Once | ORAL | Status: AC
  Administered 2022-07-01: 10 mg via ORAL
  Filled 2022-07-01: qty 1

## 2022-07-01 MED ORDER — HYDROCODONE-ACETAMINOPHEN 5-325 MG PO TABS
1.0000 | ORAL_TABLET | ORAL | Status: AC
  Administered 2022-07-01: 1 via ORAL
  Filled 2022-07-01: qty 1

## 2022-07-01 NOTE — ED Triage Notes (Signed)
Pt here due to heavy menstrual period issues. Pt states cramping and bleeding, a little more than normal.

## 2022-07-01 NOTE — Discharge Instructions (Addendum)
Please take medroxyprogesterone 3 times daily for 5 days.  You can continue with over-the-counter medications as needed for pain/cramping.  Return to the ER for any dizziness, lightheadedness, severe increasing pain, worsening symptoms or any urgent changes in your health.  Recommend following up with PCP or GYN provider for follow-up to discuss workup for abnormal uterine bleeding.

## 2022-07-01 NOTE — ED Provider Notes (Signed)
[place] [facility] AT [facility] Provider Note   CSN: [id number] Arrival date & time: 07/01/22  1519     History  Chief Complaint  Patient presents with   Abdominal Cramping    Sonya Fisher is a 37 y.o. female presents to the emergency department for evaluation of abnormal uterine bleeding.  Patient has been without a menstrual cycle for 3 months, started yesterday.  Having abnormal bleeding, states bleeding is pretty severe with lots of cramping.  She is passing some clots.  She normally has.  This for 2 to 3 days and they are light but sometimes she will skip a month.  Her periods are typically not regular.  She does not take any form of contraception.  No concern for pregnancy.  She denies any fevers chills body aches, urinary symptoms.  She has had similar symptoms 1 year ago, was placed on Provera 10 mg 3 times daily for 5 days which resolved the bleeding.  She has not followed up with GYN.   No cardiac history.  No risk of blood clots.  No clotting disorders or history of strokes.  She does not smoke.  She is not diabetic.  HPI     Home Medications Prior to Admission medications   Medication Sig Start Date End Date Taking? Authorizing Provider  aspirin 81 MG chewable tablet Chew 81 mg by mouth once.    [provider]  busPIRone (BUSPAR) 5 MG tablet Take 5 mg by mouth 2 (two) times daily. 10/17/21   [provider]  escitalopram (LEXAPRO) 20 MG tablet Take 20 mg by mouth daily. 07/10/18   [provider]  ferrous gluconate (FERGON) 324 MG tablet Take 1 tablet by mouth daily with breakfast. 08/20/21   [provider]  metoCLOPramide (REGLAN) 5 MG tablet Take 1 tablet (5 mg total) by mouth every 8 (eight) hours as needed for up to 10 days for nausea or vomiting. 03/18/19 03/28/19  Menshew, Charlesetta Ivory, PA-C      Allergies    Sulfa antibiotics, Amoxicillin, Amoxicillin-pot clavulanate, Elemental sulfur, Keflex [cephalexin],  Penicillin g, and Penicillins    Review of Systems   Review of Systems  Physical Exam Updated Vital Signs BP (!) 139/54 (BP Location: Right Arm)   Pulse 79   Temp 98.9 F (37.2 C) (Axillary)   Resp 18   LMP 06/30/2022   SpO2 100%  Physical Exam Constitutional:      Appearance: She is well-developed.  HENT:     Head: Normocephalic and atraumatic.  Eyes:     Conjunctiva/sclera: Conjunctivae normal.  Cardiovascular:     Rate and Rhythm: Normal rate.  Pulmonary:     Effort: Pulmonary effort is normal. No respiratory distress.  Abdominal:     General: There is no distension.     Tenderness: There is no abdominal tenderness. There is no right CVA tenderness, left CVA tenderness or guarding.  Musculoskeletal:        General: Normal range of motion.     Cervical back: Normal range of motion.  Skin:    General: Skin is warm.     Findings: No rash.  Neurological:     Mental Status: She is alert and oriented to person, place, and time.  Psychiatric:        Behavior: Behavior normal.        Thought Content: Thought content normal.     ED Results / Procedures / Treatments   Labs (all labs ordered  are listed, but only abnormal results are displayed) Labs Reviewed  CBC  BASIC METABOLIC PANEL  URINALYSIS, COMPLETE (UACMP) WITH MICROSCOPIC  POC URINE PREG, ED    EKG None  Radiology No results found.  Procedures Procedures    Medications Ordered in ED Medications  HYDROcodone-acetaminophen (NORCO/VICODIN) 5-325 MG per tablet 1 tablet (has no administration in time range)    ED Course/ Medical Decision Making/ A&P                             Medical Decision Making Amount and/or Complexity of Data Reviewed Labs: ordered.  Risk Prescription drug management.   37 year old female presents with abnormal uterine bleeding.  She has had heavy cramping and bleeding over the last 24 hours.  Vital signs are stable.  Blood work/CBC/BMP within normal limits.   Urinalysis negative for infection and no signs of pregnancy.  She has no history of blood clots, no other contraindications to medroxyprogesterone.  We will prescribe medroxyprogesterone 10 mg 3 times daily for 5 days.  Patient will follow-up with GYN.  She understands signs and symptoms to return to the ER for. Final Clinical Impression(s) / ED Diagnoses Final diagnoses:  None    Rx / DC Orders ED Discharge Orders     None         Ronnette Juniper 07/01/22 1800    Pilar Jarvis, MD 07/01/22 [phone]

## 2022-07-02 LAB — URINE CULTURE: Culture: <10000 — AB

## 2022-08-10 ENCOUNTER — Emergency Department
Admission: EM | Admit: 2022-08-10 | Discharge: 2022-08-10 | Disposition: A | Discharge status: Home / Self Care | Payer: BC Managed Care – PPO | Attending: Emergency Medicine | Admitting: Emergency Medicine

## 2022-08-10 ENCOUNTER — Encounter: Payer: Self-pay | Admitting: *Deleted

## 2022-08-10 ENCOUNTER — Other Ambulatory Visit: Payer: Self-pay

## 2022-08-10 DIAGNOSIS — Z853 Personal history of malignant neoplasm of breast: Secondary | ICD-10-CM | POA: Insufficient documentation

## 2022-08-10 DIAGNOSIS — R519 Headache, unspecified: Secondary | ICD-10-CM | POA: Insufficient documentation

## 2022-08-10 LAB — POC URINE PREG, ED: Preg Test, Ur: NEGATIVE

## 2022-08-10 MED ORDER — DIPHENHYDRAMINE HCL 50 MG/ML IJ SOLN
25.0000 mg | Freq: Once | INTRAMUSCULAR | Status: AC
  Administered 2022-08-10: 25 mg via INTRAVENOUS
  Filled 2022-08-10: qty 1

## 2022-08-10 MED ORDER — PROCHLORPERAZINE MALEATE 10 MG PO TABS: 10.0000 mg | ORAL_TABLET | Freq: Four times a day (QID) | ORAL | 0 refills | Status: AC | PRN

## 2022-08-10 MED ORDER — KETOROLAC TROMETHAMINE 30 MG/ML IJ SOLN
15.0000 mg | Freq: Once | INTRAMUSCULAR | Status: AC
  Administered 2022-08-10: 15 mg via INTRAVENOUS
  Filled 2022-08-10: qty 1

## 2022-08-10 MED ORDER — LACTATED RINGERS IV BOLUS
1000.0000 mL | Freq: Once | INTRAVENOUS | Status: AC
  Administered 2022-08-10: 1000 mL via INTRAVENOUS

## 2022-08-10 MED ORDER — PROCHLORPERAZINE EDISYLATE 10 MG/2ML IJ SOLN
10.0000 mg | Freq: Once | INTRAMUSCULAR | Status: AC
  Administered 2022-08-10: 10 mg via INTRAVENOUS
  Filled 2022-08-10: qty 2

## 2022-08-10 NOTE — ED Triage Notes (Signed)
Pt to triage via wheelchair.  Pt has a headache for 1.5 weeks.  Pt has nausea.  Pt taking otc meds without relief.  Pt alert  speech clear.

## 2022-08-10 NOTE — ED Provider Notes (Signed)
[facility] Provider Note    Event Date/Time   First MD Initiated Contact with Patient 08/10/22 2124     (approximate)   History   Chief Complaint Headache   HPI  Shaniquea Hnat is a 37 y.o. female with past medical history of anxiety and breast cancer presents to the ED complaining of headache.  Patient reports that she has been dealing with gradually worsening diffuse throbbing pain across her entire head for the past week.  She denies any associated fevers or neck stiffness and has not had any vision changes, speech changes, numbness, or weakness.  She has been taking Tylenol and ibuprofen without significant relief.  She does report sensitivity to light as well as occasional nausea but no vomiting.     Physical Exam   Triage Vital Signs: ED Triage Vitals  Encounter Vitals Group     BP 08/10/22 2035 126/69     Systolic BP Percentile --      Diastolic BP Percentile --      Pulse Rate 08/10/22 2035 94     Resp 08/10/22 2035 18     Temp 08/10/22 2035 98.3 F (36.8 C)     Temp Source 08/10/22 2035 Oral     SpO2 08/10/22 2035 95 %     Weight 08/10/22 2032 (!) 368 lb (166.9 kg)     Height 08/10/22 2032 5\' 9"  (1.753 m)     Head Circumference --      Peak Flow --      Pain Score 08/10/22 2032 10     Pain Loc --      Pain Education --      Exclude from Growth Chart --     Most recent vital signs: Vitals:   08/10/22 2135 08/10/22 2200  BP: 138/83 (!) 135/102  Pulse: 83 83  Resp: 18   Temp:    SpO2: 95% 97%    Constitutional: Alert and oriented. Eyes: Conjunctivae are normal. Head: Atraumatic. Nose: No congestion/rhinnorhea. Mouth/Throat: Mucous membranes are moist.  Cardiovascular: Normal rate, regular rhythm. Grossly normal heart sounds.  2+ radial pulses bilaterally. Respiratory: Normal respiratory effort.  No retractions. Lungs CTAB. Gastrointestinal: Soft and nontender. No distention. Musculoskeletal: No lower extremity tenderness nor  edema.  Neurologic:  Normal speech and language. No gross focal neurologic deficits are appreciated.    ED Results / Procedures / Treatments   Labs (all labs ordered are listed, but only abnormal results are displayed) Labs Reviewed  POC URINE PREG, ED     PROCEDURES:  Critical Care performed: No  Procedures   MEDICATIONS ORDERED IN ED: Medications  prochlorperazine (COMPAZINE) injection 10 mg (10 mg Intravenous Given 08/10/22 2205)  diphenhydrAMINE (BENADRYL) injection 25 mg (25 mg Intravenous Given 08/10/22 2205)  ketorolac (TORADOL) 30 MG/ML injection 15 mg (15 mg Intravenous Given 08/10/22 2205)  lactated ringers bolus 1,000 mL (1,000 mLs Intravenous New Bag/Given 08/10/22 2202)     IMPRESSION / MDM / ASSESSMENT AND PLAN / ED COURSE  I reviewed the triage vital signs and the nursing notes.                              37 y.o. female with past medical history of anxiety and breast cancer who presents to the ED complaining of gradually worsening headache over the past week.  Patient's presentation is most consistent with acute complicated illness / injury requiring diagnostic workup.  Differential  diagnosis includes, but is not limited to, migraine headache, tension headache, SAH, meningitis.  Patient nontoxic-appearing and in no acute distress, vital signs are unremarkable.  She reports gradually worsening headache and has a nonfocal neurologic exam, no findings concerning for [facility] and do not feel CT imaging is indicated.  No findings concerning for meningitis, symptoms most consistent with migraine headache.  Pregnancy testing is negative.  Patient treated with IV Compazine, Benadryl, and Toradol with improvement in symptoms.  She is appropriate for discharge home with PCP follow-up, was counseled to return to the ED for new or worsening symptoms.  Patient agrees with plan.      FINAL CLINICAL IMPRESSION(S) / ED DIAGNOSES   Final diagnoses:  Acute nonintractable headache,  unspecified headache type     Rx / DC Orders   ED Discharge Orders          Ordered    prochlorperazine (COMPAZINE) 10 MG tablet  Every 6 hours PRN        08/10/22 2251             Note:  This document was prepared using Dragon voice recognition software and may include unintentional dictation errors.   Chesley Noon, MD 08/10/22 [phone]
